# Patient Record
Sex: Female | Born: 1955 | Race: White | Hispanic: No | Marital: Married | Smoking: Never smoker
Health system: Southern US, Community
[De-identification: ages and names within clinical notes are randomized; demographics above are authoritative.]

## PROBLEM LIST (undated history)

## (undated) DIAGNOSIS — H269 Unspecified cataract: Secondary | ICD-10-CM

## (undated) DIAGNOSIS — M1990 Unspecified osteoarthritis, unspecified site: Secondary | ICD-10-CM

## (undated) DIAGNOSIS — E559 Vitamin D deficiency, unspecified: Secondary | ICD-10-CM

## (undated) DIAGNOSIS — R0989 Other specified symptoms and signs involving the circulatory and respiratory systems: Secondary | ICD-10-CM

## (undated) DIAGNOSIS — G44229 Chronic tension-type headache, not intractable: Secondary | ICD-10-CM

## (undated) DIAGNOSIS — F419 Anxiety disorder, unspecified: Secondary | ICD-10-CM

## (undated) DIAGNOSIS — K589 Irritable bowel syndrome without diarrhea: Secondary | ICD-10-CM

## (undated) DIAGNOSIS — M810 Age-related osteoporosis without current pathological fracture: Secondary | ICD-10-CM

## (undated) DIAGNOSIS — F32A Depression, unspecified: Secondary | ICD-10-CM

## (undated) DIAGNOSIS — T7840XA Allergy, unspecified, initial encounter: Secondary | ICD-10-CM

## (undated) DIAGNOSIS — F329 Major depressive disorder, single episode, unspecified: Secondary | ICD-10-CM

## (undated) HISTORY — DX: Allergy, unspecified, initial encounter: T78.40XA

## (undated) HISTORY — DX: Age-related osteoporosis without current pathological fracture: M81.0

## (undated) HISTORY — PX: COLON SURGERY: SHX602

## (undated) HISTORY — DX: Irritable bowel syndrome, unspecified: K58.9

## (undated) HISTORY — PX: APPENDECTOMY: SHX54

## (undated) HISTORY — DX: Major depressive disorder, single episode, unspecified: F32.9

## (undated) HISTORY — DX: Vitamin D deficiency, unspecified: E55.9

## (undated) HISTORY — DX: Depression, unspecified: F32.A

## (undated) HISTORY — DX: Other specified symptoms and signs involving the circulatory and respiratory systems: R09.89

## (undated) HISTORY — DX: Unspecified osteoarthritis, unspecified site: M19.90

## (undated) HISTORY — DX: Chronic tension-type headache, not intractable: G44.229

## (undated) HISTORY — PX: BILATERAL TEMPOROMANDIBULAR JOINT ARTHROPLASTY: SUR77

## (undated) HISTORY — DX: Unspecified cataract: H26.9

## (undated) HISTORY — DX: Anxiety disorder, unspecified: F41.9

## (undated) HISTORY — PX: ABDOMINAL HYSTERECTOMY: SHX81

---

## 1979-04-16 HISTORY — PX: TUBAL LIGATION: SHX77

## 1989-04-15 HISTORY — PX: HEMICOLECTOMY: SHX854

## 1997-11-16 ENCOUNTER — Other Ambulatory Visit: Admission: RE | Admit: 1997-11-16 | Discharge: 1997-11-16 | Payer: Self-pay | Admitting: Obstetrics & Gynecology

## 1998-11-06 ENCOUNTER — Other Ambulatory Visit: Admission: RE | Admit: 1998-11-06 | Discharge: 1998-11-06 | Payer: Self-pay | Admitting: Obstetrics & Gynecology

## 1999-01-26 ENCOUNTER — Encounter: Admission: RE | Admit: 1999-01-26 | Discharge: 1999-01-26 | Payer: Self-pay | Admitting: Obstetrics & Gynecology

## 1999-06-08 ENCOUNTER — Ambulatory Visit (HOSPITAL_COMMUNITY): Admission: RE | Admit: 1999-06-08 | Discharge: 1999-06-08 | Payer: Self-pay | Admitting: Internal Medicine

## 1999-06-08 ENCOUNTER — Encounter: Payer: Self-pay | Admitting: Internal Medicine

## 1999-12-27 ENCOUNTER — Other Ambulatory Visit: Admission: RE | Admit: 1999-12-27 | Discharge: 1999-12-27 | Payer: Self-pay | Admitting: Obstetrics & Gynecology

## 2000-01-28 ENCOUNTER — Encounter: Payer: Self-pay | Admitting: Obstetrics & Gynecology

## 2000-01-28 ENCOUNTER — Encounter: Admission: RE | Admit: 2000-01-28 | Discharge: 2000-01-28 | Payer: Self-pay | Admitting: Obstetrics & Gynecology

## 2000-09-23 ENCOUNTER — Encounter: Payer: Self-pay | Admitting: Obstetrics & Gynecology

## 2000-09-23 ENCOUNTER — Ambulatory Visit (HOSPITAL_COMMUNITY): Admission: RE | Admit: 2000-09-23 | Discharge: 2000-09-23 | Payer: Self-pay | Admitting: Obstetrics & Gynecology

## 2001-02-05 ENCOUNTER — Other Ambulatory Visit: Admission: RE | Admit: 2001-02-05 | Discharge: 2001-02-05 | Payer: Self-pay | Admitting: Obstetrics & Gynecology

## 2002-03-09 ENCOUNTER — Other Ambulatory Visit: Admission: RE | Admit: 2002-03-09 | Discharge: 2002-03-09 | Payer: Self-pay | Admitting: Obstetrics & Gynecology

## 2002-07-05 ENCOUNTER — Encounter (INDEPENDENT_AMBULATORY_CARE_PROVIDER_SITE_OTHER): Payer: Self-pay | Admitting: Specialist

## 2002-07-05 ENCOUNTER — Observation Stay (HOSPITAL_COMMUNITY): Admission: RE | Admit: 2002-07-05 | Discharge: 2002-07-06 | Payer: Self-pay | Admitting: Obstetrics & Gynecology

## 2003-03-16 ENCOUNTER — Other Ambulatory Visit: Admission: RE | Admit: 2003-03-16 | Discharge: 2003-03-16 | Payer: Self-pay | Admitting: Obstetrics & Gynecology

## 2004-05-14 ENCOUNTER — Other Ambulatory Visit: Admission: RE | Admit: 2004-05-14 | Discharge: 2004-05-14 | Payer: Self-pay | Admitting: Obstetrics & Gynecology

## 2005-05-22 ENCOUNTER — Other Ambulatory Visit: Admission: RE | Admit: 2005-05-22 | Discharge: 2005-05-22 | Payer: Self-pay | Admitting: Obstetrics & Gynecology

## 2005-08-16 ENCOUNTER — Ambulatory Visit (HOSPITAL_COMMUNITY): Admission: RE | Admit: 2005-08-16 | Discharge: 2005-08-16 | Payer: Self-pay | Admitting: Gastroenterology

## 2007-08-12 ENCOUNTER — Encounter: Admission: RE | Admit: 2007-08-12 | Discharge: 2007-08-12 | Payer: Self-pay | Admitting: Obstetrics & Gynecology

## 2007-09-08 ENCOUNTER — Emergency Department: Payer: Self-pay | Admitting: Emergency Medicine

## 2007-09-08 ENCOUNTER — Ambulatory Visit: Payer: Self-pay | Admitting: Family Medicine

## 2007-09-29 ENCOUNTER — Ambulatory Visit: Payer: Self-pay | Admitting: Internal Medicine

## 2008-05-01 ENCOUNTER — Ambulatory Visit: Payer: Self-pay | Admitting: Internal Medicine

## 2010-05-06 ENCOUNTER — Encounter: Payer: Self-pay | Admitting: Obstetrics & Gynecology

## 2010-08-31 NOTE — H&P (Signed)
Briana Charles, Charles                         ACCOUNT NO.:  [id number]   MEDICAL RECORD NO.:  [id number]                   PATIENT TYPE:  OBV   LOCATION:  9111                                 FACILITY:  [facility]   PHYSICIAN:  Freddy Finner, M.D.                DATE OF BIRTH:  [birth year]   DATE OF ADMISSION:  07/05/2002  DATE OF DISCHARGE:                                HISTORY & PHYSICAL   PREOPERATIVE DIAGNOSES:  1. Chronic pelvic pain.  2. Known history of pelvic adhesions and endometriosis.  3. Suspected adenomyosis.   POSTOPERATIVE DIAGNOSES:  1. Chronic pelvic pain.  2. Known history of pelvic adhesions and endometriosis.  3. Suspected adenomyosis.   PROCEDURE:  Laparoscopically-assisted vaginal hysterectomy, bilateral  salpingo-oophorectomy.   SURGEON:  Freddy Finner, M.D.   ASSISTANT:   ANESTHESIA:  Endotracheal.   ESTIMATED INTRAOPERATIVE BLOOD LOSS:  100 mL.   INTRAOPERATIVE COMPLICATIONS:  None.   INDICATIONS:  Details of the present admission are recorded in the admission  note.  The patient was admitted on the morning of surgery.  She received 1 g  of Cefotan IV.  She was placed on PAS hose.   DESCRIPTION OF PROCEDURE:  She was brought to the operating room and placed  under adequate general endotracheal anesthesia, placed in the dorsal  lithotomy position using the [facility] stirrup system.  A Betadine prep of the  abdomen, perineum and vagina was carried out with scrub followed by  solution.  Bladder was evacuated with a Robinson catheter.  Hulka tenaculum  was attached to the cervix under direct visualization.  Sterile drapes were  applied.  Two small incisions were made in the abdomen, one in the umbilicus  and one just above the symphysis.  Through the umbilical incision, an 11 mm  disposable nonbladed trocar was introduced while elevating the anterior  abdominal wall manually.  Direct inspection revealed adequate placement with  no evidence of injury on  entry.  As per her previous laparoscopy in 2000,  there were extensive omental adhesions through the anterior abdominal wall  consistent with previous colonic surgery.  There was no injury to vessels on  entry, and a window was created in the omentum to allow passage of the  laparoscope deeper into the abdomen to visualize the pelvis.  There were no  structures adherent to the pelvis at this point in time.  No obvious  peritoneal disease.  The uterus itself was somewhat boggy and slight  enlarged.  The tubes and ovaries were normal.  A 5 mm trocar was placed in  the lower incision and through it grasping forceps placed to elevate the  adnexae.  Using the Gyrus tripolar dissecting and cauterizing device the  infundibulopelvic ligaments and upper broad ligaments were progressively  developed, sealed and divided.  This released both tubes and ovaries to a  level just above the uterine arteries.  Attention was then turned vaginally.  A posterior weighted retractor was placed.  The cervix was grasped with a  single-tooth tenaculum and the Hulka tenaculum removed.  Colpotomy incision  was made while tenting the mucosa posterior to the cervix.  The cervix was  released from the vaginal mucosa with the scalpel and a circumferential  incision.  Using the LigaSure system, the uterosacral pedicles were sealed  and divided.  The bladder was advanced.  Lateral Kochers were taken with the  LigaSure sealed and divided.  The bladder was further advanced off the  cervix and lower segment.  Cardinal ligament pedicles were taken.  LigaSure  sealed and divided.  Anterior peritoneum was entered.  The vessel pedicles  were then taken with the LigaSure system, sealed and divided.  The uterus  was delivered through the vaginal introitus.  The remaining small pedicles  on each side were sealed with LigaSure and divided.  Angles of the vagina  were anchored to the uterosacrals with mattress sutures of 0 Monocryl.   The  uterosacrals were plicated and posterior peritoneum closed with 0 Monocryl.  The cuff was closed vertically with figure-of-eight sutures of 0 Monocryl.  The Foley catheter was placed.  The inspection laparoscopically was then  carried out.  One small area near the left uterosacral was coagulated with  the Gyrus forceps.  This produced complete hemostasis.  The gas was allowed  to escape from the abdomen.  All instruments were removed.  The abdominal  incisions were closed with interrupted subcuticular sutures of 3-0 Dexon and  0.25 inch Steri-Strips to the lower incision.  Marcaine 0.25% plain was  injected into the incision sites for postoperative analgesia.  The patient  was awakened and taken to recovery in good condition.                                               Freddy Finner, M.D.    WRN/MEDQ  D:  07/05/2002  T:  07/05/2002  Job:  [id number]

## 2010-08-31 NOTE — H&P (Signed)
Briana Charles, Briana Charles                         ACCOUNT NO.:  [id number]   MEDICAL RECORD NO.:  [id number]                   PATIENT TYPE:  AMB   LOCATION:  SDC                                  FACILITY:  [facility]   PHYSICIAN:  Freddy Finner, M.D.                DATE OF BIRTH:  Feb 01, 1956   DATE OF ADMISSION:  07/05/2002  DATE OF DISCHARGE:                                HISTORY & PHYSICAL   ADMISSION DIAGNOSES:  1. Chronic pelvic pain, suspected adenomyosis.  2. Previous diagnosis of pelvic endometriosis, pelvic adhesions, previous     surgical treatment by laparoscopy on two different occasions with     uterosacral nerve ablation, lysis of adhesions, and vaporization of     endometriosis.   HISTORY OF PRESENT ILLNESS:  The patient is a 55 year old white married  female, gravida 2, para 2, who had tubal sterilization in 1980.  She was  first seen at my practice in 1996.  At that time she was having some  dysmenorrhea and subsequent underwent laparoscopy in 1990 with treatment as  described above.  Over the course of the years, the patient has had chronic  complaints, particularly of right-sided lower abdominal and pelvic pain,  some pain into her gluteal area.  She has had extensive evaluations  including neurologic, gastroenterology, and even had partial colectomy for  abnormal cecum with removal of the appendix at that time.  Her most recent  laparoscopy was in 2002, at which time she also had lysis of adhesions due  to cycled nerve ablation and progression of pelvic endometriosis.  Omental  adhesions from her previous colectomy were not resected at that time.  Recent CTs of the abdomen and pelvis had been normal.  The patient continues  to complain of pain and dysmenorrhea.  She has requested definitive pelvic  surgery and is now admitted for laparoscopically assisted vaginal  hysterectomy and bilateral salpingo-oophorectomy.   PAST SURGICAL HISTORY:  __________ .   REVIEW OF  SYSTEMS:  No cardiopulmonary, GI, or other symptoms.   PAST MEDICAL HISTORY:  Basically as outlined above.  The patient has given  birth to two children vaginally.  She has had orthopedic evaluation with no  definitive diagnosis as well as neurological with no definitive diagnosis.   ALLERGIES:  Her only known drug allergy was to SULFA.   SOCIAL HISTORY:  She is not a cigarette smoker.  She does not use alcohol.  She has never had a blood transfusion.   FAMILY HISTORY:  Noncontributory.   PHYSICAL EXAMINATION:  HEENT:  Grossly within normal limits.  NECK:  Thyroid gland is not palpably enlarged.  VITAL SIGNS:  Blood pressure in the office was 116/74.  Weight 140.5.  CHEST:  Clear to auscultation.  HEART:  Normal sinus rhythm, significant for systolic click (please note  that she has a previous diagnosis made by 2-D echo of mitral valve  prolapse).  BREASTS:  Exam is considered to be normal.  No masses, no palpable nodules.  Skin is normal.  No breast discharge.  Most recent mammogram was in October  of 2003 and was normal.  ABDOMEN:  Soft and nontender without appreciable organomegaly.  PELVIC:  External genitalia, vagina, and cervix were normal.  On bimanual  exam the uterus is anterior in position, slightly enlarged and boggy, and  mild to moderately tender.  EXTREMITIES:  Without cyanosis, clubbing, or edema.   ASSESSMENT:  Chronic pelvic pain.  Extensive previous workup noted above.  Known previous pelvic endometriosis and pelvic adhesions.  Suspected  adenomyosis.   PLAN:  Laparoscopically assisted vaginal hysterectomy, bilateral salpingo-  oophorectomy.  The patient has reviewed a video in the office describing the  operative procedure including potential complications.  She is prepared to  proceed at this time with surgery.                                                  Freddy Finner, M.D.    WRN/MEDQ  D:  07/04/2002  T:  07/04/2002  Job:  [id number]

## 2010-08-31 NOTE — Op Note (Signed)
Briana Charles, BOESEN               ACCOUNT NO.:  [id number]   MEDICAL RECORD NO.:  [id number]          PATIENT TYPE:  AMB   LOCATION:  ENDO                         FACILITY:  [facility]   PHYSICIAN:  Bernette Redbird, M.D.   DATE OF BIRTH:  January 01, 1956   DATE OF PROCEDURE:  08/16/2005  DATE OF DISCHARGE:                                 OPERATIVE REPORT   PROCEDURE:  Colonoscopy.   ENDOSCOPIST:  Bernette Redbird, M.D.   INDICATION:  Family history of colon cancer in a 55 year old female.  Her  father recently passed away from colon cancer, having been diagnosed in his  late 65s.  The patient herself is remotely status post a partial right  hemicolectomy because of a floppy cecum with questionable cecal volvulus  accounting for abdominal pain in the past.   FINDINGS:  Normal exam to the ileocolonic anastomosis.   PROCEDURE:  The purpose and the risks of the procedure had been discussed  with the patient, who provided written consent.  Sedation was fentanyl 75  mcg, Versed 6 mg and Phenergan 12.5 mg IV, without arrhythmias or  desaturation.  The Olympus adjustable-tension pediatric video colonoscope  was advanced to the ileocolonic anastomosis, as evidenced by the absence of  further lumen, transillumination of the right mid-abdominal region, and what  I believe was small bowel mucosa, although the small bowel was not freely  intubated.  Pullback was then performed.  The quality of prep was good,  although a certain amount of suctioning and rinsing were needed to get up  some puddles of liquid stool.  It is felt that essentially all areas were  adequately seen.   This was a normal examination.  No polyps, cancer, colitis, vascular  malformations or diverticulosis were noted and retroflexion in the rectum  and reinspection in the rectum were unremarkable.  No biopsies were  obtained.  The patient tolerated the procedure well and there were no  apparent complications.   IMPRESSION:  Normal  screening colonoscopy in a patient with a family history  of colon cancer in a first-degree relative at advanced age (V16.0).   PLAN:  Repeat colonoscopy in 5 years.           ______________________________  Bernette Redbird, M.D.     RB/MEDQ  D:  08/16/2005  T:  08/17/2005  Job:  [id number]   cc:   Sheppard Plumber. Earlene Plater, M.D.  [address]  [place]  [place] [address]   W. Varney Baas, M.D.  Fax: [phone]   Lucky Cowboy, M.D.  Fax: [phone]

## 2012-09-30 ENCOUNTER — Other Ambulatory Visit: Payer: Self-pay | Admitting: Internal Medicine

## 2012-09-30 DIAGNOSIS — R1011 Right upper quadrant pain: Secondary | ICD-10-CM

## 2012-10-02 ENCOUNTER — Ambulatory Visit
Admission: RE | Admit: 2012-10-02 | Discharge: 2012-10-02 | Disposition: A | Discharge status: Home / Self Care | Payer: Managed Care, Other (non HMO) | Source: Ambulatory Visit | Attending: Internal Medicine | Admitting: Internal Medicine

## 2012-10-02 DIAGNOSIS — R1011 Right upper quadrant pain: Secondary | ICD-10-CM

## 2013-04-29 ENCOUNTER — Encounter: Payer: Self-pay | Admitting: *Deleted

## 2013-04-29 NOTE — Progress Notes (Signed)
This encounter was created in error - please disregard.

## 2013-09-22 ENCOUNTER — Other Ambulatory Visit: Payer: Self-pay | Admitting: Internal Medicine

## 2014-01-30 DIAGNOSIS — F32A Depression, unspecified: Secondary | ICD-10-CM | POA: Insufficient documentation

## 2014-01-30 DIAGNOSIS — K589 Irritable bowel syndrome without diarrhea: Secondary | ICD-10-CM | POA: Insufficient documentation

## 2014-01-30 DIAGNOSIS — R0989 Other specified symptoms and signs involving the circulatory and respiratory systems: Secondary | ICD-10-CM | POA: Insufficient documentation

## 2014-01-30 DIAGNOSIS — E559 Vitamin D deficiency, unspecified: Secondary | ICD-10-CM | POA: Insufficient documentation

## 2014-01-30 DIAGNOSIS — G44229 Chronic tension-type headache, not intractable: Secondary | ICD-10-CM | POA: Insufficient documentation

## 2014-01-30 DIAGNOSIS — F329 Major depressive disorder, single episode, unspecified: Secondary | ICD-10-CM | POA: Insufficient documentation

## 2014-02-02 ENCOUNTER — Ambulatory Visit (INDEPENDENT_AMBULATORY_CARE_PROVIDER_SITE_OTHER): Payer: Managed Care, Other (non HMO) | Admitting: Internal Medicine

## 2014-02-02 ENCOUNTER — Encounter: Payer: Self-pay | Admitting: Internal Medicine

## 2014-02-02 VITALS — BP 110/72 | HR 72 | Temp 97.8°F | Resp 16 | Ht 68.0 in | Wt 139.8 lb

## 2014-02-02 DIAGNOSIS — Z0000 Encounter for general adult medical examination without abnormal findings: Secondary | ICD-10-CM | POA: Insufficient documentation

## 2014-02-02 DIAGNOSIS — Z1212 Encounter for screening for malignant neoplasm of rectum: Secondary | ICD-10-CM

## 2014-02-02 DIAGNOSIS — K589 Irritable bowel syndrome without diarrhea: Secondary | ICD-10-CM

## 2014-02-02 DIAGNOSIS — Z113 Encounter for screening for infections with a predominantly sexual mode of transmission: Secondary | ICD-10-CM

## 2014-02-02 DIAGNOSIS — R6889 Other general symptoms and signs: Secondary | ICD-10-CM

## 2014-02-02 DIAGNOSIS — Z23 Encounter for immunization: Secondary | ICD-10-CM

## 2014-02-02 DIAGNOSIS — R945 Abnormal results of liver function studies: Secondary | ICD-10-CM

## 2014-02-02 DIAGNOSIS — E559 Vitamin D deficiency, unspecified: Secondary | ICD-10-CM

## 2014-02-02 DIAGNOSIS — R0989 Other specified symptoms and signs involving the circulatory and respiratory systems: Secondary | ICD-10-CM

## 2014-02-02 DIAGNOSIS — E782 Mixed hyperlipidemia: Secondary | ICD-10-CM

## 2014-02-02 DIAGNOSIS — G44229 Chronic tension-type headache, not intractable: Secondary | ICD-10-CM

## 2014-02-02 DIAGNOSIS — I10 Essential (primary) hypertension: Secondary | ICD-10-CM

## 2014-02-02 DIAGNOSIS — R7989 Other specified abnormal findings of blood chemistry: Secondary | ICD-10-CM

## 2014-02-02 DIAGNOSIS — Z0001 Encounter for general adult medical examination with abnormal findings: Secondary | ICD-10-CM

## 2014-02-02 DIAGNOSIS — Z79899 Other long term (current) drug therapy: Secondary | ICD-10-CM

## 2014-02-02 DIAGNOSIS — Z111 Encounter for screening for respiratory tuberculosis: Secondary | ICD-10-CM

## 2014-02-02 DIAGNOSIS — R7309 Other abnormal glucose: Secondary | ICD-10-CM

## 2014-02-02 NOTE — Progress Notes (Signed)
Patient ID: Briana EmmerRhonda Charles, female   DOB: [birth year], 58 y.o.   MRN: [id number]  Annual Screening/Preventative Comprehensive Examination  This very nice 58 y.o.MWF presents for complete physical.  Patient has been monitored for HTN,  Prediabetes, Hyperlipidemia, GERD, IBS,  Chronic HA's and Vitamin D Deficiency. Patient does have hx/o depression and apparently is doing well on current therapy.    Patient has hx/o elevated BP in the remote past and has been monitored expectantly.  Patient's BP has been controlled at home and patient denies any cardiac symptoms as chest pain, palpitations, shortness of breath, dizziness or ankle swelling. Today's BP: 110/72 mmHg    Patient's hyperlipidemia is controlled with diet. Last lipid was at goal - TC 177, TG 66, HDL 75 and LDL 89 in Oct 2014. Marland Kitchen. Patient denies myalgias or other medication SE's. Last lipids were    Patient has been screened for prediabetes and patient denies reactive hypoglycemic symptoms, visual blurring, diabetic polys, or paresthesias. Last A1c was 5.5% in Oct 2014.   Finally, patient has history of Vitamin D Deficiency and last Vitamin D was 2194 in Oct  2014.  Medication Sig  . buPROPion (WELLBUTRIN XL) 300 MG 24 hr tablet Take 300 mg by mouth daily.  Marland Kitchen. dicyclomine (BENTYL) 20 MG tablet TAKE 1 TABLET BY MOUTH FOUR TIMES DAILY BEFORE A MEAL AND AT BEDTIME   Allergies  Allergen Reactions  . Luvox [Fluvoxamine] Nausea And Vomiting  . Sulfa Antibiotics Rash   Past Medical History  Diagnosis Date  . IBS (irritable bowel syndrome)   . Labile hypertension   . Vitamin D deficiency   . Depression   . Chronic tension headaches    Health Maintenance  Topic Date Due  . Pap Smear  12/11/1972  . Mammogram  08/11/2009  . Influenza Vaccine  11/13/2013  . Tetanus/tdap  01/11/2020  . Colonoscopy  02/05/2021   Immunization History  Administered Date(s) Administered  . Influenza Split 02/02/2014  . PPD Test 02/02/2014  .  Pneumococcal-Unspecified 01/27/2012  . Tdap 01/10/2010   Past Surgical History  Procedure Laterality Date  . Tubal ligation  1981  . Bilateral temporomandibular joint arthroplasty  1986, 1988  . Hemicolectomy  1991   Family History  Problem Relation Age of Onset  . Cancer Mother     lung  . Cancer Father     colon  . Alzheimer's disease Father    History  Substance Use Topics  . Smoking status: Never Smoker   . Smokeless tobacco: Never Used  . Alcohol Use: Yes     Comment: Social   ROS Constitutional: Denies fever, chills, weight loss/gain, headaches, insomnia, fatigue, night sweats, and change in appetite. Eyes: Denies redness, blurred vision, diplopia, discharge, itchy, watery eyes.  ENT: Denies discharge, congestion, post nasal drip, epistaxis, sore throat, earache, hearing loss, dental pain, Tinnitus, Vertigo, Sinus pain, snoring.  Cardio: Denies chest pain, palpitations, irregular heartbeat, syncope, dyspnea, diaphoresis, orthopnea, PND, claudication, edema Respiratory: denies cough, dyspnea, DOE, pleurisy, hoarseness, laryngitis, wheezing.  Gastrointestinal: Denies dysphagia, heartburn, reflux, water brash, pain, cramps, nausea, vomiting, bloating, diarrhea, constipation, hematemesis, melena, hematochezia, jaundice, hemorrhoids Genitourinary: Denies dysuria, frequency, urgency, nocturia, hesitancy, discharge, hematuria, flank pain Breast: Breast lumps, nipple discharge, bleeding.  Musculoskeletal: Denies arthralgia, myalgia, stiffness, Jt. Swelling, pain, limp, and strain/sprain. Denies falls. Skin: Denies puritis, rash, hives, warts, acne, eczema, changing in skin lesion Neuro: No weakness, tremor, incoordination, spasms, paresthesia, pain Psychiatric: Denies confusion, memory loss, sensory loss. Denies Depression. Endocrine: Denies change  in weight, skin, hair change, nocturia, and paresthesia, diabetic polys, visual blurring, hyper / hypo glycemic episodes.  Heme/Lymph:  No excessive bleeding, bruising, enlarged lymph nodes.  Physical Exam  BP 110/72  Pulse 72  Temp97.8 F  Resp 16  Ht 5\' 8"    Wt 139 lb 12.8 oz   BMI 21.26   General Appearance: Well nourished and in no apparent distress. Eyes: PERRLA, EOMs, conjunctiva no swelling or erythema, normal fundi and vessels. Sinuses: No frontal/maxillary tenderness ENT/Mouth: EACs patent / TMs  nl. Nares clear without erythema, swelling, mucoid exudates. Oral hygiene is good. No erythema, swelling, or exudate. Tongue normal, non-obstructing. Tonsils not swollen or erythematous. Hearing normal.  Neck: Supple, thyroid normal. No bruits, nodes or JVD. Respiratory: Respiratory effort normal.  BS equal and clear bilateral without rales, rhonci, wheezing or stridor. Cardio: Heart sounds are normal with regular rate and rhythm and no murmurs, rubs or gallops. Peripheral pulses are normal and equal bilaterally without edema. No aortic or femoral bruits. Chest: symmetric with normal excursions and percussion. Breasts: Deferred to Dr Jennette KettleNeal Abdomen: Flat, soft, with bowl sounds. Nontender, no guarding, rebound, hernias, masses, or organomegaly.  Lymphatics: Non tender without lymphadenopathy.  Genitourinary: Deferred to Dr Konrad Doloreson Neal Musculoskeletal: Full ROM all peripheral extremities, joint stability, 5/5 strength, and normal gait. Skin: Warm and dry without rashes, lesions, cyanosis, clubbing or  ecchymosis.  Neuro: Cranial nerves intact, reflexes equal bilaterally. Normal muscle tone, no cerebellar symptoms. Sensation intact.  Pysch: Awake and oriented X 3, normal affect, Insight and Judgment appropriate.   Assessment and Plan  1. Annual Screening Examination 2. Labile  Hypertension  3. Hyperlipidemia 4. Pre Diabetes 5. Vitamin D Deficiency   Continue prudent diet as discussed, weight control, BP monitoring, regular exercise, and medications. Discussed med's effects and SE's. Screening labs and tests as  requested with regular follow-up as recommended.

## 2014-02-02 NOTE — Patient Instructions (Signed)
Recommend the book "The END of DIETING" by Dr Baker Janus   and the book "The END of DIABETES " by Dr Excell Seltzer  At [email or web address] - get book & Audio CD's      Being diabetic has a  300% increased risk for heart attack, stroke, cancer, and alzheimer- type vascular dementia. It is very important that you work harder with diet by avoiding all foods that are white except chicken & fish. Avoid white rice (brown & wild rice is OK), white potatoes (sweetpotatoes in moderation is OK), White bread or wheat bread or anything made out of white flour like bagels, donuts, rolls, buns, biscuits, cakes, pastries, cookies, pizza crust, and pasta (made from white flour & egg whites) - vegetarian pasta or spinach or wheat pasta is OK. Multigrain breads like Arnold's or [employer], or multigrain sandwich thins or flatbreads.  Diet, exercise and weight loss can reverse and cure diabetes in the early stages.  Diet, exercise and weight loss is very important in the control and prevention of complications of diabetes which affects every system in your body, ie. Brain - dementia/stroke, eyes - glaucoma/blindness, heart - heart attack/heart failure, kidneys - dialysis, stomach - gastric paralysis, intestines - malabsorption, nerves - severe painful neuritis, circulation - gangrene & loss of a leg(s), and finally cancer and Alzheimers.    I recommend avoid fried & greasy foods,  sweets/candy, white rice (brown or wild rice or Quinoa is OK), white potatoes (sweet potatoes are OK) - anything made from white flour - bagels, doughnuts, rolls, buns, biscuits,white and wheat breads, pizza crust and traditional pasta made of white flour & egg white(vegetarian pasta or spinach or wheat pasta is OK).  Multi-grain bread is OK - like multi-grain flat bread or sandwich thins. Avoid alcohol in excess. Exercise is also important.    Eat all the vegetables you want - avoid meat, especially red meat and dairy - especially cheese.  Cheese  is the most concentrated form of trans-fats which is the worst thing to clog up our arteries. Veggie cheese is OK which can be found in the fresh produce section at [employer] or [employer] or [employer]  Preventive Care for Adults A healthy lifestyle and preventive care can promote health and wellness. Preventive health guidelines for women include the following key practices.  A routine yearly physical is a good way to check with your health care provider about your health and preventive screening. It is a chance to share any concerns and updates on your health and to receive a thorough exam.  Visit your dentist for a routine exam and preventive care every 6 months. Brush your teeth twice a day and floss once a day. Good oral hygiene prevents tooth decay and gum disease.  The frequency of eye exams is based on your age, health, family medical history, use of contact lenses, and other factors. Follow your health care provider's recommendations for frequency of eye exams.  Eat a healthy diet. Foods like vegetables, fruits, whole grains, low-fat dairy products, and lean protein foods contain the nutrients you need without too many calories. Decrease your intake of foods high in solid fats, added sugars, and salt. Eat the right amount of calories for you.Get information about a proper diet from your health care provider, if necessary.  Regular physical exercise is one of the most important things you can do for your health. Most adults should get at least 150 minutes of moderate-intensity exercise (any activity that increases  your heart rate and causes you to sweat) each week. In addition, most adults need muscle-strengthening exercises on 2 or more days a week.  Maintain a healthy weight. The body mass index (BMI) is a screening tool to identify possible weight problems. It provides an estimate of body fat based on height and weight. Your health care provider can find your BMI and can help you  achieve or maintain a healthy weight.For adults 20 years and older:  A BMI below 18.5 is considered underweight.  A BMI of 18.5 to 24.9 is normal.  A BMI of 25 to 29.9 is considered overweight.  A BMI of 30 and above is considered obese.  Maintain normal blood lipids and cholesterol levels by exercising and minimizing your intake of saturated fat. Eat a balanced diet with plenty of fruit and vegetables. Blood tests for lipids and cholesterol should begin at age 38 and be repeated every 5 years. If your lipid or cholesterol levels are high, you are over 50, or you are at high risk for heart disease, you may need your cholesterol levels checked more frequently.Ongoing high lipid and cholesterol levels should be treated with medicines if diet and exercise are not working.  If you smoke, find out from your health care provider how to quit. If you do not use tobacco, do not start.  Lung cancer screening is recommended for adults aged 64-80 years who are at high risk for developing lung cancer because of a history of smoking. A yearly low-dose CT scan of the lungs is recommended for people who have at least a 30-pack-year history of smoking and are a current smoker or have quit within the past 15 years. A pack year of smoking is smoking an average of 1 pack of cigarettes a day for 1 year (for example: 1 pack a day for 30 years or 2 packs a day for 15 years). Yearly screening should continue until the smoker has stopped smoking for at least 15 years. Yearly screening should be stopped for people who develop a health problem that would prevent them from having lung cancer treatment.  High blood pressure causes heart disease and increases the risk of stroke. Your blood pressure should be checked at least every 1 to 2 years. Ongoing high blood pressure should be treated with medicines if weight loss and exercise do not work.  If you are 79-57 years old, ask your health care provider if you should take  aspirin to prevent strokes.  Diabetes screening involves taking a blood sample to check your fasting blood sugar level. This should be done once every 3 years, after age 37, if you are within normal weight and without risk factors for diabetes. Testing should be considered at a younger age or be carried out more frequently if you are overweight and have at least 1 risk factor for diabetes.  Breast cancer screening is essential preventive care for women. You should practice "breast self-awareness." This means understanding the normal appearance and feel of your breasts and may include breast self-examination. Any changes detected, no matter how small, should be reported to a health care provider. Women in their 76s and 30s should have a clinical breast exam (CBE) by a health care provider as part of a regular health exam every 1 to 3 years. After age 55, women should have a CBE every year. Starting at age 79, women should consider having a mammogram (breast X-ray test) every year. Women who have a family history of breast  cancer should talk to their health care provider about genetic screening. Women at a high risk of breast cancer should talk to their health care providers about having an MRI and a mammogram every year.  Breast cancer gene (BRCA)-related cancer risk assessment is recommended for women who have family members with BRCA-related cancers. BRCA-related cancers include breast, ovarian, tubal, and peritoneal cancers. Having family members with these cancers may be associated with an increased risk for harmful changes (mutations) in the breast cancer genes BRCA1 and BRCA2. Results of the assessment will determine the need for genetic counseling and BRCA1 and BRCA2 testing.  Routine pelvic exams to screen for cancer are no longer recommended for nonpregnant women who are considered low risk for cancer of the pelvic organs (ovaries, uterus, and vagina) and who do not have symptoms. Ask your health  care provider if a screening pelvic exam is right for you.  If you have had past treatment for cervical cancer or a condition that could lead to cancer, you need Pap tests and screening for cancer for at least 20 years after your treatment. If Pap tests have been discontinued, your risk factors (such as having a new sexual partner) need to be reassessed to determine if screening should be resumed. Some women have medical problems that increase the chance of getting cervical cancer. In these cases, your health care provider may recommend more frequent screening and Pap tests.  Colorectal cancer can be detected and often prevented. Most routine colorectal cancer screening begins at the age of 58 years and continues through age 44 years. However, your health care provider may recommend screening at an earlier age if you have risk factors for colon cancer. On a yearly basis, your health care provider may provide home test kits to check for hidden blood in the stool. Use of a small camera at the end of a tube, to directly examine the colon (sigmoidoscopy or colonoscopy), can detect the earliest forms of colorectal cancer. Talk to your health care provider about this at age 84, when routine screening begins. Direct exam of the colon should be repeated every 5-10 years through age 33 years, unless early forms of pre-cancerous polyps or small growths are found.  Hepatitis C blood testing is recommended for all people born from [birth year] through 1965 and any individual with known risks for hepatitis C.  Pra  Osteoporosis is a disease in which the bones lose minerals and strength with aging. This can result in serious bone fractures or breaks. The risk of osteoporosis can be identified using a bone density scan. Women ages 36 years and over and women at risk for fractures or osteoporosis should discuss screening with their health care providers. Ask your health care provider whether you should take a calcium supplement  or vitamin D to reduce the rate of osteoporosis.  Menopause can be associated with physical symptoms and risks. Hormone replacement therapy is available to decrease symptoms and risks. You should talk to your health care provider about whether hormone replacement therapy is right for you.  Use sunscreen. Apply sunscreen liberally and repeatedly throughout the day. You should seek shade when your shadow is shorter than you. Protect yourself by wearing long sleeves, pants, a wide-brimmed hat, and sunglasses year round, whenever you are outdoors.  Once a month, do a whole body skin exam, using a mirror to look at the skin on your back. Tell your health care provider of new moles, moles that have irregular borders, moles that are  larger than a pencil eraser, or moles that have changed in shape or color.  Stay current with required vaccines (immunizations).  Influenza vaccine. All adults should be immunized every year.  Tetanus, diphtheria, and acellular pertussis (Td, Tdap) vaccine. Pregnant women should receive 1 dose of Tdap vaccine during each pregnancy. The dose should be obtained regardless of the length of time since the last dose. Immunization is preferred during the 27th-36th week of gestation. An adult who has not previously received Tdap or who does not know her vaccine status should receive 1 dose of Tdap. This initial dose should be followed by tetanus and diphtheria toxoids (Td) booster doses every 10 years. Adults with an unknown or incomplete history of completing a 3-dose immunization series with Td-containing vaccines should begin or complete a primary immunization series including a Tdap dose. Adults should receive a Td booster every 10 years.  Varicella vaccine. An adult without evidence of immunity to varicella should receive 2 doses or a second dose if she has previously received 1 dose. Pregnant females who do not have evidence of immunity should receive the first dose after  pregnancy. This first dose should be obtained before leaving the health care facility. The second dose should be obtained 4-8 weeks after the first dose.  Human papillomavirus (HPV) vaccine. Females aged 13-26 years who have not received the vaccine previously should obtain the 3-dose series. The vaccine is not recommended for use in pregnant females. However, pregnancy testing is not needed before receiving a dose. If a female is found to be pregnant after receiving a dose, no treatment is needed. In that case, the remaining doses should be delayed until after the pregnancy. Immunization is recommended for any person with an immunocompromised condition through the age of 26 years if she did not get any or all doses earlier. During the 3-dose series, the second dose should be obtained 4-8 weeks after the first dose. The third dose should be obtained 24 weeks after the first dose and 16 weeks after the second dose.  Zoster vaccine. One dose is recommended for adults aged 60 years or older unless certain conditions are present.  Measles, mumps, and rubella (MMR) vaccine. Adults born before 1957 generally are considered immune to measles and mumps. Adults born in 1957 or later should have 1 or more doses of MMR vaccine unless there is a contraindication to the vaccine or there is laboratory evidence of immunity to each of the three diseases. A routine second dose of MMR vaccine should be obtained at least 28 days after the first dose for students attending postsecondary schools, health care workers, or international travelers. People who received inactivated measles vaccine or an unknown type of measles vaccine during 1963-1967 should receive 2 doses of MMR vaccine. People who received inactivated mumps vaccine or an unknown type of mumps vaccine before 1979 and are at high risk for mumps infection should consider immunization with 2 doses of MMR vaccine. For females of childbearing age, rubella immunity should  be determined. If there is no evidence of immunity, females who are not pregnant should be vaccinated. If there is no evidence of immunity, females who are pregnant should delay immunization until after pregnancy. Unvaccinated health care workers born before 1957 who lack laboratory evidence of measles, mumps, or rubella immunity or laboratory confirmation of disease should consider measles and mumps immunization with 2 doses of MMR vaccine or rubella immunization with 1 dose of MMR vaccine.  Pneumococcal 13-valent conjugate (PCV13) vaccine. When   indicated, a person who is uncertain of her immunization history and has no record of immunization should receive the PCV13 vaccine. An adult aged 34 years or older who has certain medical conditions and has not been previously immunized should receive 1 dose of PCV13 vaccine. This PCV13 should be followed with a dose of pneumococcal polysaccharide (PPSV23) vaccine. The PPSV23 vaccine dose should be obtained at least 8 weeks after the dose of PCV13 vaccine. An adult aged 43 years or older who has certain medical conditions and previously received 1 or more doses of PPSV23 vaccine should receive 1 dose of PCV13. The PCV13 vaccine dose should be obtained 1 or more years after the last PPSV23 vaccine dose.    Pneumococcal polysaccharide (PPSV23) vaccine. When PCV13 is also indicated, PCV13 should be obtained first. All adults aged 57 years and older should be immunized. An adult younger than age 7 years who has certain medical conditions should be immunized. Any person who resides in a nursing home or long-term care facility should be immunized. An adult smoker should be immunized. People with an immunocompromised condition and certain other conditions should receive both PCV13 and PPSV23 vaccines. People with human immunodeficiency virus (HIV) infection should be immunized as soon as possible after diagnosis. Immunization during chemotherapy or radiation therapy should  be avoided. Routine use of PPSV23 vaccine is not recommended for American Indians, [address], or people younger than 65 years unless there are medical conditions that require PPSV23 vaccine. When indicated, people who have unknown immunization and have no record of immunization should receive PPSV23 vaccine. One-time revaccination 5 years after the first dose of PPSV23 is recommended for people aged 19-64 years who have chronic kidney failure, nephrotic syndrome, asplenia, or immunocompromised conditions. People who received 1-2 doses of PPSV23 before age 56 years should receive another dose of PPSV23 vaccine at age 42 years or later if at least 5 years have passed since the previous dose. Doses of PPSV23 are not needed for people immunized with PPSV23 at or after age 73 years.  Preventive Services / Frequency   Ages 46 to 64 years  Blood pressure check..  Lipid and cholesterol check.  Lung cancer screening. / Every year if you are aged 55-80 years and have a 30-pack-year history of smoking and currently smoke or have quit within the past 15 years. Yearly screening is stopped once you have quit smoking for at least 15 years or develop a health problem that would prevent you from having lung cancer treatment.  Clinical breast exam.** / Every year after age 35 years.  BRCA-related cancer risk assessment.** / For women who have family members with a BRCA-related cancer (breast, ovarian, tubal, or peritoneal cancers).  Mammogram.** / Every year beginning at age 50 years and continuing for as long as you are in good health. Consult with your health care provider.  Pap test.** / Every 3 years starting at age 47 years through age 22 or 95 years with a history of 3 consecutive normal Pap tests.  HPV screening.** / Every 3 years from ages 57 years through ages 79 to 60 years with a history of 3 consecutive normal Pap tests.  Fecal occult blood test (FOBT) of stool. / Every year beginning at age  92 years and continuing until age 16 years. You may not need to do this test if you get a colonoscopy every 10 years.  Flexible sigmoidoscopy or colonoscopy.** / Every 5 years for a flexible sigmoidoscopy or every 10 years  for a colonoscopy beginning at age 67 years and continuing until age 94 years.  Hepatitis C blood test.** / For all people born from [birth year] through 1966 and any individual with known risks for hepatitis C.  Skin self-exam. / Monthly.  Influenza vaccine. / Every year.  Tetanus, diphtheria, and acellular pertussis (Tdap/Td) vaccine.** / Consult your health care provider. Pregnant women should receive 1 dose of Tdap vaccine during each pregnancy. 1 dose of Td every 10 years.  Varicella vaccine.** / Consult your health care provider. Pregnant females who do not have evidence of immunity should receive the first dose after pregnancy.  Zoster vaccine.** / 1 dose for adults aged 77 years or older.                                                                                    Pneumococcal 13-valent conjugate (PCV13) vaccine. (PREVNAR)  Pneumococcal polysaccharide (PPSV23) vaccine. (PNEUMOVAX)    Hepatitis A vaccine.** / Consult your health care provider.  Hepatitis B vaccine.** / Consult your health care provider.

## 2014-02-03 LAB — MICROALBUMIN / CREATININE URINE RATIO
CREATININE, URINE: 12.9 mg/dL
Microalb, Ur: <0.2 mg/dL (ref <2.0)

## 2014-02-03 LAB — HEPATIC FUNCTION PANEL
ALK PHOS: 42 U/L (ref 39–117)
ALT: 24 U/L (ref 0–35)
AST: 28 U/L (ref 0–37)
Albumin: 4.2 g/dL (ref 3.5–5.2)
Bilirubin, Direct: 0.1 mg/dL (ref 0.0–0.3)
Indirect Bilirubin: 0.2 mg/dL (ref 0.2–1.2)
TOTAL PROTEIN: 6.8 g/dL (ref 6.0–8.3)
Total Bilirubin: 0.3 mg/dL (ref 0.2–1.2)

## 2014-02-03 LAB — CBC WITH DIFFERENTIAL/PLATELET
BASOS ABS: 0.1 10*3/uL (ref 0.0–0.1)
BASOS PCT: 1 % (ref 0–1)
EOS ABS: 0.2 10*3/uL (ref 0.0–0.7)
EOS PCT: 3 % (ref 0–5)
HEMATOCRIT: 37.9 % (ref 36.0–46.0)
Hemoglobin: 12.7 g/dL (ref 12.0–15.0)
Lymphocytes Relative: 31 % (ref 12–46)
Lymphs Abs: 1.8 10*3/uL (ref 0.7–4.0)
MCH: 29.3 pg (ref 26.0–34.0)
MCHC: 33.5 g/dL (ref 30.0–36.0)
MCV: 87.3 fL (ref 78.0–100.0)
MONO ABS: 0.4 10*3/uL (ref 0.1–1.0)
Monocytes Relative: 7 % (ref 3–12)
Neutro Abs: 3.4 10*3/uL (ref 1.7–7.7)
Neutrophils Relative %: 58 % (ref 43–77)
PLATELETS: 261 10*3/uL (ref 150–400)
RBC: 4.34 MIL/uL (ref 3.87–5.11)
RDW: 13.6 % (ref 11.5–15.5)
WBC: 5.8 10*3/uL (ref 4.0–10.5)

## 2014-02-03 LAB — IRON AND TIBC
%SAT: 25 % (ref 20–55)
IRON: 75 ug/dL (ref 42–145)
TIBC: 304 ug/dL (ref 250–470)
UIBC: 229 ug/dL (ref 125–400)

## 2014-02-03 LAB — BASIC METABOLIC PANEL WITH GFR
BUN: 10 mg/dL (ref 6–23)
CALCIUM: 9.5 mg/dL (ref 8.4–10.5)
CO2: 28 mEq/L (ref 19–32)
Chloride: 103 mEq/L (ref 96–112)
Creat: 0.8 mg/dL (ref 0.50–1.10)
GFR, EST NON AFRICAN AMERICAN: 81 mL/min
Glucose, Bld: 86 mg/dL (ref 70–99)
Potassium: 5 mEq/L (ref 3.5–5.3)
Sodium: 138 mEq/L (ref 135–145)

## 2014-02-03 LAB — HEPATITIS C ANTIBODY: HCV Ab: NEGATIVE

## 2014-02-03 LAB — LIPID PANEL
CHOLESTEROL: 167 mg/dL (ref 0–200)
HDL: 73 mg/dL (ref >39)
LDL CALC: 83 mg/dL (ref 0–99)
Total CHOL/HDL Ratio: 2.3 Ratio
Triglycerides: 55 mg/dL (ref <150)
VLDL: 11 mg/dL (ref 0–40)

## 2014-02-03 LAB — URINALYSIS, MICROSCOPIC ONLY
BACTERIA UA: NONE SEEN
Casts: NONE SEEN
Crystals: NONE SEEN

## 2014-02-03 LAB — MAGNESIUM: Magnesium: 2.1 mg/dL (ref 1.5–2.5)

## 2014-02-03 LAB — INSULIN, FASTING: INSULIN FASTING, SERUM: 2.3 u[IU]/mL (ref 2.0–19.6)

## 2014-02-03 LAB — VITAMIN D 25 HYDROXY (VIT D DEFICIENCY, FRACTURES): Vit D, 25-Hydroxy: 86 ng/mL (ref 30–89)

## 2014-02-03 LAB — VITAMIN B12: Vitamin B-12: 882 pg/mL (ref 211–911)

## 2014-02-03 LAB — HEPATITIS A ANTIBODY, TOTAL: Hep A Total Ab: NONREACTIVE

## 2014-02-03 LAB — HEPATITIS B SURFACE ANTIBODY,QUALITATIVE: HEP B S AB: NEGATIVE

## 2014-02-03 LAB — HIV ANTIBODY (ROUTINE TESTING W REFLEX): HIV 1&2 Ab, 4th Generation: NONREACTIVE

## 2014-02-03 LAB — HEMOGLOBIN A1C
Hgb A1c MFr Bld: 5.6 % (ref <5.7)
MEAN PLASMA GLUCOSE: 114 mg/dL (ref <117)

## 2014-02-03 LAB — RPR

## 2014-02-03 LAB — HEPATITIS B CORE ANTIBODY, TOTAL: Hep B Core Total Ab: NONREACTIVE

## 2014-02-03 LAB — TSH: TSH: 1.277 u[IU]/mL (ref 0.350–4.500)

## 2014-02-04 LAB — HEPATITIS B E ANTIBODY: Hepatitis Be Antibody: NONREACTIVE

## 2014-02-04 LAB — TB SKIN TEST
Induration: 0 mm
TB Skin Test: NEGATIVE

## 2014-03-21 ENCOUNTER — Encounter: Payer: Self-pay | Admitting: Physician Assistant

## 2014-03-21 ENCOUNTER — Ambulatory Visit (INDEPENDENT_AMBULATORY_CARE_PROVIDER_SITE_OTHER): Payer: Managed Care, Other (non HMO) | Admitting: Physician Assistant

## 2014-03-21 VITALS — BP 138/82 | HR 68 | Temp 98.2°F | Resp 16 | Ht 67.5 in | Wt 140.0 lb

## 2014-03-21 DIAGNOSIS — G44229 Chronic tension-type headache, not intractable: Secondary | ICD-10-CM

## 2014-03-21 DIAGNOSIS — R1011 Right upper quadrant pain: Secondary | ICD-10-CM

## 2014-03-21 DIAGNOSIS — R1013 Epigastric pain: Secondary | ICD-10-CM

## 2014-03-21 DIAGNOSIS — J0100 Acute maxillary sinusitis, unspecified: Secondary | ICD-10-CM

## 2014-03-21 LAB — CBC WITH DIFFERENTIAL/PLATELET
BASOS ABS: 0.1 10*3/uL (ref 0.0–0.1)
BASOS PCT: 1 % (ref 0–1)
Eosinophils Absolute: 0.2 10*3/uL (ref 0.0–0.7)
Eosinophils Relative: 4 % (ref 0–5)
HCT: 39.1 % (ref 36.0–46.0)
Hemoglobin: 13.3 g/dL (ref 12.0–15.0)
Lymphocytes Relative: 28 % (ref 12–46)
Lymphs Abs: 1.5 10*3/uL (ref 0.7–4.0)
MCH: 29.2 pg (ref 26.0–34.0)
MCHC: 34 g/dL (ref 30.0–36.0)
MCV: 85.7 fL (ref 78.0–100.0)
MPV: 10 fL (ref 9.4–12.4)
Monocytes Absolute: 0.5 10*3/uL (ref 0.1–1.0)
Monocytes Relative: 9 % (ref 3–12)
NEUTROS PCT: 58 % (ref 43–77)
Neutro Abs: 3 10*3/uL (ref 1.7–7.7)
PLATELETS: 275 10*3/uL (ref 150–400)
RBC: 4.56 MIL/uL (ref 3.87–5.11)
RDW: 13.9 % (ref 11.5–15.5)
WBC: 5.2 10*3/uL (ref 4.0–10.5)

## 2014-03-21 LAB — AMYLASE: AMYLASE: 40 U/L (ref 0–105)

## 2014-03-21 LAB — LIPASE: LIPASE: 19 U/L (ref 0–75)

## 2014-03-21 MED ORDER — MOMETASONE FUROATE 50 MCG/ACT NA SUSP: 2.0000 | Freq: Every day | NASAL | Status: DC

## 2014-03-21 MED ORDER — MELOXICAM 7.5 MG PO TABS: 7.5000 mg | ORAL_TABLET | Freq: Every day | ORAL | Status: DC

## 2014-03-21 MED ORDER — PREDNISONE 5 MG PO TABS: ORAL_TABLET | ORAL | Status: AC

## 2014-03-21 NOTE — Patient Instructions (Addendum)
- Take Allegra OTC (lowest strength possible) for allergies and to help with fluid behind ear drums.   -While drinking fluids, pinch and hold nose close and swallow.  This will help open up your eustachian tubes to drain the fluid behind your ear drums. -Try steam showers to open your nasal passages.  Drink lots of water to stay hydrated and to thin mucous. -Nasonex-  Take 2 sprays in each nostril at bedtime.  Make sure you spray towards the outside of each nostril towards the outer corner of your eye, hold nose close and tilt head back.  This will help the medication get into your sinuses.   -Take Mobic with food.  Do NOT take Ibuprofen, Advil or Aleve or any other NSAID while on this medication.  -Briana Charles will call you for appt date and time for ultrasound.    Tension Headache A tension headache is a feeling of pain, pressure, or aching often felt over the front and sides of the head. The pain can be dull or can feel tight (constricting). It is the most common type of headache. Tension headaches are not normally associated with nausea or vomiting and do not get worse with physical activity. Tension headaches can last 30 minutes to several days.  CAUSES  The exact cause is not known, but it may be caused by chemicals and hormones in the brain that lead to pain. Tension headaches often begin after stress, anxiety, or depression. Other triggers may include:  Alcohol.  Caffeine (too much or withdrawal).  Respiratory infections (colds, flu, sinus infections).  Dental problems or teeth clenching.  Fatigue.  Holding your head and neck in one position too long while using a computer. SYMPTOMS   Pressure around the head.   Dull, aching head pain.   Pain felt over the front and sides of the head.   Tenderness in the muscles of the head, neck, and shoulders. DIAGNOSIS  A tension headache is often diagnosed based on:   Symptoms.   Physical examination.   A CT scan or MRI of your  head. These tests may be ordered if symptoms are severe or unusual. TREATMENT  Medicines may be given to help relieve symptoms.  HOME CARE INSTRUCTIONS   Only take over-the-counter or prescription medicines for pain or discomfort as directed by your caregiver.   Lie down in a dark, quiet room when you have a headache.   Keep a journal to find out what may be triggering your headaches. For example, write down:  What you eat and drink.  How much sleep you get.  Any change to your diet or medicines.  Try massage or other relaxation techniques.   Ice packs or heat applied to the head and neck can be used. Use these 3 to 4 times per day for 15 to 20 minutes each time, or as needed.   Limit stress.   Sit up straight, and do not tense your muscles.   Quit smoking if you smoke.  Limit alcohol use.  Decrease the amount of caffeine you drink, or stop drinking caffeine.  Eat and exercise regularly.  Get 7 to 9 hours of sleep, or as recommended by your caregiver.  Avoid excessive use of pain medicine as recurrent headaches can occur.  SEEK MEDICAL CARE IF:   You have problems with the medicines you were prescribed.  Your medicines do not work.  You have a change from the usual headache.  You have nausea or vomiting. SEEK IMMEDIATE MEDICAL  CARE IF:   Your headache becomes severe.  You have a fever.  You have a stiff neck.  You have loss of vision.  You have muscular weakness or loss of muscle control.  You lose your balance or have trouble walking.  You feel faint or pass out.  You have severe symptoms that are different from your first symptoms. MAKE SURE YOU:   Understand these instructions.  Will watch your condition.  Will get help right away if you are not doing well or get worse. Document Released: 04/01/2005 Document Revised: 06/24/2011 Document Reviewed: 03/22/2011 [facility] Patient Information 2015 [place], [place]. This information is not  intended to replace advice given to you by your health care provider. Make sure you discuss any questions you have with your health care provider.  Sinusitis Sinusitis is redness, soreness, and inflammation of the paranasal sinuses. Paranasal sinuses are air pockets within the bones of your face (beneath the eyes, the middle of the forehead, or above the eyes). In healthy paranasal sinuses, mucus is able to drain out, and air is able to circulate through them by way of your nose. However, when your paranasal sinuses are inflamed, mucus and air can become trapped. This can allow bacteria and other germs to grow and cause infection. Sinusitis can develop quickly and last only a short time (acute) or continue over a long period (chronic). Sinusitis that lasts for more than 12 weeks is considered chronic.  CAUSES  Causes of sinusitis include:  Allergies.  Structural abnormalities, such as displacement of the cartilage that separates your nostrils (deviated septum), which can decrease the air flow through your nose and sinuses and affect sinus drainage.  Functional abnormalities, such as when the small hairs (cilia) that line your sinuses and help remove mucus do not work properly or are not present. SIGNS AND SYMPTOMS  Symptoms of acute and chronic sinusitis are the same. The primary symptoms are pain and pressure around the affected sinuses. Other symptoms include:  Upper toothache.  Earache.  Headache.  Bad breath.  Decreased sense of smell and taste.  A cough, which worsens when you are lying flat.  Fatigue.  Fever.  Thick drainage from your nose, which often is green and may contain pus (purulent).  Swelling and warmth over the affected sinuses. DIAGNOSIS  Your health care provider will perform a physical exam. During the exam, your health care provider may:  Look in your nose for signs of abnormal growths in your nostrils (nasal polyps).  Tap over the affected sinus to check  for signs of infection.  View the inside of your sinuses (endoscopy) using an imaging device that has a light attached (endoscope). If your health care provider suspects that you have chronic sinusitis, one or more of the following tests may be recommended:  Allergy tests.  Nasal culture. A sample of mucus is taken from your nose, sent to a lab, and screened for bacteria.  Nasal cytology. A sample of mucus is taken from your nose and examined by your health care provider to determine if your sinusitis is related to an allergy. TREATMENT  Most cases of acute sinusitis are related to a viral infection and will resolve on their own within 10 days. Sometimes medicines are prescribed to help relieve symptoms (pain medicine, decongestants, nasal steroid sprays, or saline sprays).  However, for sinusitis related to a bacterial infection, your health care provider will prescribe antibiotic medicines. These are medicines that will help kill the bacteria causing the infection.  Rarely, sinusitis is caused by a fungal infection. In theses cases, your health care provider will prescribe antifungal medicine. For some cases of chronic sinusitis, surgery is needed. Generally, these are cases in which sinusitis recurs more than 3 times per year, despite other treatments. HOME CARE INSTRUCTIONS   Drink plenty of water. Water helps thin the mucus so your sinuses can drain more easily.  Use a humidifier.  Inhale steam 3 to 4 times a day (for example, sit in the bathroom with the shower running).  Apply a warm, moist washcloth to your face 3 to 4 times a day, or as directed by your health care provider.  Use saline nasal sprays to help moisten and clean your sinuses.  Take medicines only as directed by your health care provider.  If you were prescribed either an antibiotic or antifungal medicine, finish it all even if you start to feel better. SEEK IMMEDIATE MEDICAL CARE IF:  You have increasing pain or  severe headaches.  You have nausea, vomiting, or drowsiness.  You have swelling around your face.  You have vision problems.  You have a stiff neck.  You have difficulty breathing. MAKE SURE YOU:   Understand these instructions.  Will watch your condition.  Will get help right away if you are not doing well or get worse. Document Released: 04/01/2005 Document Revised: 08/16/2013 Document Reviewed: 04/16/2011 [facility] Patient Information 2015 [place], [place]. This information is not intended to replace advice given to you by your health care provider. Make sure you discuss any questions you have with your health care provider.

## 2014-03-21 NOTE — Progress Notes (Signed)
Subjective:    Patient ID: Briana EmmerRhonda Charles, female    DOB: 1955-05-22, 58 y.o.   MRN: [id number]  Headache  This is a new problem. Episode onset: 1 week ago. The problem occurs intermittently. The problem has been waxing and waning. Pain location: Pain from neck up.  Pain is all over. The pain does not radiate. The pain quality is similar to prior headaches. The pain is at a severity of 7/10. Associated symptoms include tinnitus. Pertinent negatives include no dizziness, ear pain, fever, rhinorrhea, sinus pressure or sore throat. Associated symptoms comments: Facial puffiness. Nothing (Wakes up with headaches) aggravates the symptoms. Treatments tried: Advil and Tylenol. The treatment provided no relief.  OTC allergy medication makes her nervous. Patient had total hysterectomy. GFR= 81 on 02/02/14 Review of Systems  Constitutional: Positive for fatigue. Negative for fever, chills and diaphoresis.  HENT: Positive for postnasal drip and tinnitus. Negative for congestion, ear discharge, ear pain, rhinorrhea, sinus pressure, sore throat and trouble swallowing.   Eyes: Negative.   Respiratory: Negative.   Cardiovascular: Negative.   Gastrointestinal: Negative.   Genitourinary: Positive for flank pain. Negative for dysuria, urgency, frequency, vaginal bleeding, vaginal discharge and pelvic pain.       Right flank pain  Musculoskeletal: Negative.        Aches and pains  Skin: Negative.  Negative for rash.  Neurological: Positive for headaches. Negative for dizziness and light-headedness.  Psychiatric/Behavioral: The patient is nervous/anxious.        Anxiety, stress and depression   Past Medical History  Diagnosis Date  . IBS (irritable bowel syndrome)   . Labile hypertension   . Vitamin D deficiency   . Depression   . Chronic tension headaches    Current Outpatient Prescriptions on File Prior to Visit  Medication Sig Dispense Refill  . buPROPion (WELLBUTRIN XL) 300 MG 24 hr tablet Take 300  mg by mouth daily.    . Cholecalciferol (VITAMIN D PO) Take 3,000 Units by mouth daily.    Marland Kitchen. dicyclomine (BENTYL) 20 MG tablet TAKE 1 TABLET BY MOUTH FOUR TIMES DAILY BEFORE A MEAL AND AT BEDTIME 120 tablet 0  . MINIVELLE 0.1 MG/24HR patch      No current facility-administered medications on file prior to visit.   Allergies  Allergen Reactions  . Luvox [Fluvoxamine] Nausea And Vomiting  . Sulfa Antibiotics Rash     BP 138/82 mmHg  Pulse 68  Temp(Src) 98.2 F (36.8 C) (Temporal)  Resp 16  Ht 5' 7.5" (1.715 m)  Wt 140 lb (63.504 kg)  BMI 21.59 kg/m2  SpO2 97% Wt Readings from Last 3 Encounters:  03/21/14 140 lb (63.504 kg)  02/02/14 139 lb 12.8 oz (63.413 kg)   Objective:   Physical Exam  Constitutional: She is oriented to person, place, and time. She appears well-developed and well-nourished. She does not have a sickly appearance. No distress.  HENT:  Head: Normocephalic.  Right Ear: External ear and ear canal normal. Tympanic membrane is bulging. Tympanic membrane is not injected, not scarred, not perforated, not erythematous and not retracted.  Left Ear: External ear and ear canal normal. Tympanic membrane is bulging. Tympanic membrane is not injected, not scarred, not perforated, not erythematous and not retracted.  Nose: No mucosal edema or rhinorrhea. Right sinus exhibits maxillary sinus tenderness. Right sinus exhibits no frontal sinus tenderness. Left sinus exhibits maxillary sinus tenderness. Left sinus exhibits no frontal sinus tenderness.  Mouth/Throat: Uvula is midline, oropharynx is clear and moist and mucous  membranes are normal. Mucous membranes are not pale and not dry. No trismus in the jaw. No uvula swelling. No oropharyngeal exudate, posterior oropharyngeal edema, posterior oropharyngeal erythema or tonsillar abscesses.  TMs bulging bilaterally with clear fluid.  TMs non-erythematous and non-edematous bilaterally. Turbinates mildly erythematous bilaterally  Eyes:  Conjunctivae, EOM and lids are normal. Pupils are equal, round, and reactive to light. Right eye exhibits no discharge. Left eye exhibits no discharge. No scleral icterus.  Neck: Trachea normal, normal range of motion and phonation normal. Neck supple. No tracheal tenderness present. No tracheal deviation present.  Cardiovascular: Normal rate, regular rhythm, S1 normal, S2 normal, normal heart sounds, intact distal pulses and normal pulses.  Exam reveals no gallop, no distant heart sounds and no friction rub.   No murmur heard. Pulmonary/Chest: Effort normal and breath sounds normal. No stridor. No respiratory distress. She has no decreased breath sounds. She has no wheezes. She has no rhonchi. She has no rales. She exhibits no tenderness.  Abdominal: Soft. Bowel sounds are normal. She exhibits no distension, no abdominal bruit and no mass. There is no hepatosplenomegaly. There is tenderness in the right upper quadrant and epigastric area. There is positive Murphy's sign. There is no rigidity, no rebound, no guarding and no CVA tenderness. No hernia.  Pulsatile distance of aorta was about 4cm with palpation above umbilicus.  Lymphadenopathy:  No tenderness or LAD.  Neurological: She is alert and oriented to person, place, and time. She has normal strength. No cranial nerve deficit or sensory deficit. Gait normal.  Skin: Skin is warm, dry and intact. No rash noted. She is not diaphoretic.  Psychiatric: She has a normal mood and affect. Her speech is normal and behavior is normal. Judgment and thought content normal. Cognition and memory are normal.  Vitals reviewed.  Assessment & Plan:  1. RUQ abdominal pain & epigastric pain -Ordered labs to rule out infection and pancreatitis.  -Ordered ultrasound to R/O cholecystitis or cholelithiasis- US Abdomen Complete; Future - Amylase - Lipase - CBC with Differential  2. Acute maxillary sinusitis, recurrence not specified -Take Allegra OTC (lowest dose  possible) daily. - Take Nasonex as prescribed for inflammation - mometasone (NASONEX) 50 MCG/ACT nasal spray; Place 2 sprays into the nose daily.  Dispense: 17 g; Refill: 1 - Take Prednisone as prescribed for inflammation- predniSONE (DELTASONE) 5 MG tablet; Take 3 tablets PO for 2 days, then take 2 tablets PO for 2 days, then take 1 tablet PO for 3 days.  Dispense: 13 tablet; Refill: 0  3. Chronic tension-type headache, not intractable -Take Mobic as prescribed for pain with food- meloxicam (MOBIC) 7.5 MG tablet; Take 1 tablet (7.5 mg total) by mouth daily. Can take up to 2 tablets daily.  Dispense: 30 tablet; Refill: 1  Will message patient on MyChart with lab and ultrasound results. Discussed medication effects and SE's.  Pt agreed to treatment plan. Please follow up in one month for chronic headaches.  Cai Anfinson, Lise AuerJennifer L, PA-C 10:59 AM [facility]

## 2014-03-23 ENCOUNTER — Encounter: Payer: Self-pay | Admitting: Physician Assistant

## 2014-03-24 ENCOUNTER — Ambulatory Visit (HOSPITAL_COMMUNITY)
Admission: RE | Admit: 2014-03-24 | Discharge: 2014-03-24 | Disposition: A | Discharge status: Home / Self Care | Payer: Managed Care, Other (non HMO) | Source: Ambulatory Visit | Attending: Physician Assistant | Admitting: Physician Assistant

## 2014-03-24 DIAGNOSIS — R1011 Right upper quadrant pain: Secondary | ICD-10-CM | POA: Insufficient documentation

## 2014-03-24 DIAGNOSIS — R1013 Epigastric pain: Secondary | ICD-10-CM

## 2014-04-13 ENCOUNTER — Ambulatory Visit: Payer: Self-pay | Admitting: Physician Assistant

## 2014-04-18 ENCOUNTER — Ambulatory Visit: Payer: Self-pay | Admitting: Physician Assistant

## 2014-04-20 ENCOUNTER — Ambulatory Visit: Payer: Self-pay | Admitting: Physician Assistant

## 2014-05-03 ENCOUNTER — Ambulatory Visit (INDEPENDENT_AMBULATORY_CARE_PROVIDER_SITE_OTHER): Payer: 59 | Admitting: Physician Assistant

## 2014-05-03 ENCOUNTER — Encounter: Payer: Self-pay | Admitting: Physician Assistant

## 2014-05-03 VITALS — BP 118/64 | HR 60 | Temp 98.0°F | Resp 16 | Ht 67.5 in | Wt 138.0 lb

## 2014-05-03 DIAGNOSIS — R51 Headache: Secondary | ICD-10-CM

## 2014-05-03 DIAGNOSIS — R519 Headache, unspecified: Secondary | ICD-10-CM

## 2014-05-03 MED ORDER — VERAPAMIL HCL 40 MG PO TABS: 40.0000 mg | ORAL_TABLET | Freq: Three times a day (TID) | ORAL | Status: DC

## 2014-05-03 MED ORDER — ELETRIPTAN HYDROBROMIDE 40 MG PO TABS: ORAL_TABLET | ORAL | Status: DC

## 2014-05-03 NOTE — Progress Notes (Signed)
HPI  A Caucasian 59 y.o.female presents to the office today due to follow up for chronic headaches and ear pain.  Patient was last seen on 03/21/14 and was prescribed mobic.  She states the mobic is not helping her headaches.  She has seen headache clinic before and they try her off caffeine and there was no difference.  Patient had headaches in the past and they have subsided for years, but had reoccurring headaches since beginning of December 2015.  The pain is located mostly bilaterally in parietal/temporal scalp area and the pain is 7 out of 10 right now.  She states "her head feels too heavy for her neck".  The pain will last for hours.  She has tried OTC pain medications with no relief.  The pain has gotten worse in the past 2-3 days and goes to her right jaw, ear and neck.  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  She is currently on Xanax for anxiety and Wellbutrin for depression and states the medications are working.  She reports ear pain, photophobia, sometimes hearing sensitivity and neck pain.  She denies fever, chills sweats, fatigue, congestion, sinus pressure, rhinorrhea, post-nasal drip, sore throat, cough, SOB, CP, abdominal pain, nausea, vomiting, diarrhea, constipation, dizziness, lightheadedness.    Review of Systems  All other systems reviewed and are negative.  Past Medical History-  Past Medical History  Diagnosis Date  . IBS (irritable bowel syndrome)   . Labile hypertension   . Vitamin D deficiency   . Depression   . Chronic tension headaches    Medications-  Current Outpatient Prescriptions on File Prior to Visit  Medication Sig Dispense Refill  . ALPRAZolam (XANAX) 0.5 MG tablet Take 0.5 mg by mouth as needed for anxiety.    Marland Kitchen. buPROPion (WELLBUTRIN XL) 300 MG 24 hr tablet Take 300 mg by mouth daily.    . Cholecalciferol (VITAMIN D PO) Take 3,000 Units by mouth daily.    Marland Kitchen. dicyclomine (BENTYL) 20 MG tablet TAKE 1 TABLET BY MOUTH FOUR TIMES DAILY BEFORE A  MEAL AND AT BEDTIME 120 tablet 0  . meloxicam (MOBIC) 7.5 MG tablet Take 1 tablet (7.5 mg total) by mouth daily. Can take up to 2 tablets daily. 30 tablet 1  . MINIVELLE 0.1 MG/24HR patch     . mometasone (NASONEX) 50 MCG/ACT nasal spray Place 2 sprays into the nose daily. 17 g 1   No current facility-administered medications on file prior to visit.   Allergies-  Allergies  Allergen Reactions  . Luvox [Fluvoxamine] Nausea And Vomiting  . Sulfa Antibiotics Rash   Physical Exam BP 118/64 mmHg  Pulse 60  Temp(Src) 98 F (36.7 C) (Temporal)  Resp 16  Ht 5' 7.5" (1.715 m)  Wt 138 lb (62.596 kg)  BMI 21.28 kg/m2 Wt Readings from Last 3 Encounters:  05/03/14 138 lb (62.596 kg)  03/21/14 140 lb (63.504 kg)  02/02/14 139 lb 12.8 oz (63.413 kg)  Vitals Reviewed. General Appearance: Well nourished, in no apparent distress and had pleasant demeanor. Eyes:  PERRLA. EOMI. Conjunctiva is pink without edema, erythema or yellowing.  No scleral icterus. Sinuses: No Frontal/maxillary tenderness Ears: No erythema, edema or tenderness on both external ear cartilages and ear canals.  TMs are intact bilaterally with normal light reflexes and bulging with clear fluid.  TMs non-erythematous and non-edematous bilaterally. Nose: Nose is symmetrical and turbinates are pink and not erythematous, edematous or  pale.    No polyps, tenderness or  rhinorrhea. Throat: Oral pharynx is pink and moist.  No erythema, edema or tenderness in pharynx or posterior pharynx Mucosa is intact and without lesions. Tonsils are at +1 station bilaterally and do not have exudate.    Uvula is midline and not swollen. No TMJ pain due to prior TMJ arthroplasty.   Neck: Supple,  JVD,  carotid bruits,  LAD, trachea is midline.  Full range of motion in neck intact Respiratory: CTAB,  r/r/w or stridor. No increased effort of breathing. Cardio: RRR.   m/r/g.  S1S2nl.   Abdomen: Symmetrical, soft, nontender, and flat.  +BS nl x4.    Extremities:  C/C/E in upper and lower extremities. Pulses B/L +2  Skin: Warm, dry, intact without rashes, lesions, ecchymosis, yellowing, cyanosis.  Neuro: Alert and oriented X3, cooperative.  Mood and affect appropriate to situation.  CN II-XII grossly intact.  Normal gait.   Psych: Insight and Judgment appropriate.   Assessment and Plan 1. Chronic nonintractable headache, unspecified headache type - eletriptan (RELPAX) 40 MG tablet; One tablet by mouth at onset of headache. May repeat in 2 hours if headache persists or recurs. No more than 2 tablets in 24 hour period  Dispense: 10 tablet; Refill: 0 Was going to start patient on Verapamil, but changed mind after sending in prescription.  Patient has h/o RBBB on EKG from 02/02/14.  Lurena Joiner called the pharmacy to cancel prescription.  Patient agreed that she only wants to try one medication at a time.    Discussed medication effects and SE's.  Pt agreed to treatment plan. Please follow up in 1 month for med recheck.  Landynn Dupler, Lise Auer, PA-C 3:22 PM [facility]

## 2014-05-03 NOTE — Patient Instructions (Addendum)
-  Take relpax for severe headaches only.    Please follow up in 1 month. Please make sure you are drinking water to stay hydrated.  Migraine Headache A migraine headache is an intense, throbbing pain on one or both sides of your head. A migraine can last for 30 minutes to several hours. CAUSES  The exact cause of a migraine headache is not always known. However, a migraine may be caused when nerves in the brain become irritated and release chemicals that cause inflammation. This causes pain. Certain things may also trigger migraines, such as:  Alcohol.  Smoking.  Stress.  Menstruation.  Aged cheeses.  Foods or drinks that contain nitrates, glutamate, aspartame, or tyramine.  Lack of sleep.  Chocolate.  Caffeine.  Hunger.  Physical exertion.  Fatigue.  Medicines used to treat chest pain (nitroglycerine), birth control pills, estrogen, and some blood pressure medicines. SIGNS AND SYMPTOMS  Pain on one or both sides of your head.  Pulsating or throbbing pain.  Severe pain that prevents daily activities.  Pain that is aggravated by any physical activity.  Nausea, vomiting, or both.  Dizziness.  Pain with exposure to bright lights, loud noises, or activity.  General sensitivity to bright lights, loud noises, or smells. Before you get a migraine, you may get warning signs that a migraine is coming (aura). An aura may include:  Seeing flashing lights.  Seeing bright spots, halos, or zigzag lines.  Having tunnel vision or blurred vision.  Having feelings of numbness or tingling.  Having trouble talking.  Having muscle weakness. DIAGNOSIS  A migraine headache is often diagnosed based on:  Symptoms.  Physical exam.  A CT scan or MRI of your head. These imaging tests cannot diagnose migraines, but they can help rule out other causes of headaches. TREATMENT Medicines may be given for pain and nausea. Medicines can also be given to help prevent recurrent  migraines.  HOME CARE INSTRUCTIONS  Only take over-the-counter or prescription medicines for pain or discomfort as directed by your health care provider. The use of long-term narcotics is not recommended.  Lie down in a dark, quiet room when you have a migraine.  Keep a journal to find out what may trigger your migraine headaches. For example, write down:  What you eat and drink.  How much sleep you get.  Any change to your diet or medicines.  Limit alcohol consumption.  Quit smoking if you smoke.  Get 7-9 hours of sleep, or as recommended by your health care provider.  Limit stress.  Keep lights dim if bright lights bother you and make your migraines worse. SEEK IMMEDIATE MEDICAL CARE IF:   Your migraine becomes severe.  You have a fever.  You have a stiff neck.  You have vision loss.  You have muscular weakness or loss of muscle control.  You start losing your balance or have trouble walking.  You feel faint or pass out.  You have severe symptoms that are different from your first symptoms. MAKE SURE YOU:   Understand these instructions.  Will watch your condition.  Will get help right away if you are not doing well or get worse. Document Released: 04/01/2005 Document Revised: 08/16/2013 Document Reviewed: 12/07/2012 [facility] Patient Information 2015 [place], [place]. This information is not intended to replace advice given to you by your health care provider. Make sure you discuss any questions you have with your health care provider.

## 2014-06-14 ENCOUNTER — Ambulatory Visit: Payer: Self-pay | Admitting: Internal Medicine

## 2014-08-10 ENCOUNTER — Encounter: Payer: Self-pay | Admitting: Internal Medicine

## 2014-08-10 MED ORDER — BUTALBITAL-APAP-CAFFEINE 50-500-40 MG PO TABS: 1.0000 | ORAL_TABLET | Freq: Four times a day (QID) | ORAL | Status: DC | PRN

## 2014-08-22 ENCOUNTER — Other Ambulatory Visit (HOSPITAL_COMMUNITY): Payer: Self-pay | Admitting: Obstetrics & Gynecology

## 2014-08-23 LAB — CYTOLOGY - PAP

## 2014-12-30 ENCOUNTER — Ambulatory Visit
Admission: EM | Admit: 2014-12-30 | Discharge: 2014-12-30 | Disposition: A | Discharge status: Home / Self Care | Payer: 59 | Attending: Family Medicine | Admitting: Family Medicine

## 2014-12-30 ENCOUNTER — Encounter: Payer: Self-pay | Admitting: Emergency Medicine

## 2014-12-30 DIAGNOSIS — N12 Tubulo-interstitial nephritis, not specified as acute or chronic: Secondary | ICD-10-CM

## 2014-12-30 DIAGNOSIS — N159 Renal tubulo-interstitial disease, unspecified: Secondary | ICD-10-CM

## 2014-12-30 LAB — URINALYSIS COMPLETE WITH MICROSCOPIC (ARMC ONLY)
Bilirubin Urine: NEGATIVE
Glucose, UA: NEGATIVE mg/dL
Nitrite: NEGATIVE
PH: 6 (ref 5.0–8.0)
PROTEIN: 100 mg/dL — AB
SQUAMOUS EPITHELIAL / LPF: NONE SEEN — AB
Specific Gravity, Urine: 1.025 (ref 1.005–1.030)

## 2014-12-30 MED ORDER — CIPROFLOXACIN HCL 500 MG PO TABS
500.0000 mg | ORAL_TABLET | Freq: Once | ORAL | Status: AC
  Administered 2014-12-30: 500 mg via ORAL

## 2014-12-30 MED ORDER — CIPROFLOXACIN HCL 500 MG PO TABS: 500.0000 mg | ORAL_TABLET | Freq: Two times a day (BID) | ORAL | Status: DC

## 2014-12-30 MED ORDER — CEFTRIAXONE SODIUM 1 G IJ SOLR
1.0000 g | Freq: Once | INTRAMUSCULAR | Status: AC
  Administered 2014-12-30: 1 g via INTRAMUSCULAR

## 2014-12-30 MED ORDER — PHENAZOPYRIDINE HCL 200 MG PO TABS: 200.0000 mg | ORAL_TABLET | Freq: Three times a day (TID) | ORAL | Status: DC | PRN

## 2014-12-30 NOTE — ED Notes (Signed)
Pt with uti s/s x 1 day

## 2014-12-30 NOTE — Discharge Instructions (Signed)
Pyelonephritis, Adult °Pyelonephritis is a kidney infection. A kidney infection can happen quickly, or it can last for a long time. °HOME CARE  °· Take your medicine (antibiotics) as told. Finish it even if you start to feel better. °· Keep all doctor visits as told. °· Drink enough fluids to keep your pee (urine) clear or pale yellow. °· Only take medicine as told by your doctor. °GET HELP RIGHT AWAY IF:  °· You have a fever or lasting symptoms for more than 2-3 days. °· You have a fever and your symptoms suddenly get worse. °· You cannot take your medicine or drink fluids as told. °· You have chills and shaking. °· You feel very weak or pass out (faint). °· You do not feel better after 2 days. °MAKE SURE YOU: °· Understand these instructions. °· Will watch your condition. °· Will get help right away if you are not doing well or get worse. °Document Released: 05/09/2004 Document Revised: 10/01/2011 Document Reviewed: 09/19/2010 °Exit[employer]® Patient Information ©2015 [employer]. This information is not intended to replace advice given to you by your health care provider. Make sure you discuss any questions you have with your health care provider. ° °

## 2014-12-30 NOTE — ED Provider Notes (Signed)
CSN: [id number]     Arrival date & time 12/30/14  1803 History   First MD Initiated Contact with Patient 12/30/14 1849     Chief Complaint  Patient presents with  . Urinary Tract Infection    Patient reports yesterday started having frequency some mild dysuria but had to get up 3 times last night which she never does. She states that she continued having low abdominal pain and lower back pain all during the day she used a heating pad but did not get much relief for her stomach pain. So she finally came in to be seen and evaluated. (Consider location/radiation/quality/duration/timing/severity/associated sxs/prior Treatment) Patient is a 59 y.o. female presenting with urinary tract infection. The history is provided by the patient. No language interpreter was used.  Urinary Tract Infection Pain quality:  Aching Pain severity:  Moderate Duration:  2 days Timing:  Constant Progression:  Worsening Chronicity:  New Relieved by:  Nothing Ineffective treatments: Heating pad. Urinary symptoms: frequent urination   Urinary symptoms: no foul-smelling urine, no hesitancy and no bladder incontinence   Associated symptoms: fever   Associated symptoms: no abdominal pain, no genital lesions, no nausea and no vaginal discharge   Risk factors: no hx of pyelonephritis, no hx of urolithiasis, no kidney transplant, not pregnant, no recurrent urinary tract infections, no renal cysts, no renal disease, not single kidney, no sexually transmitted infections and no urinary catheter     She reports having a hysterectomy and oophorectomy. She's had a colonoscopy. She's had sounds like a colostomy is some redundant loop of bowels that was causing problems and pain and chronic constipation that was multiple years ago. She reports being essentially healthy and does not smoke. Nurse's notes were reviewed.  Past Medical History  Diagnosis Date  . IBS (irritable bowel syndrome)   . Labile hypertension   . Vitamin D  deficiency   . Depression   . Chronic tension headaches    Past Surgical History  Procedure Laterality Date  . Tubal ligation  1981  . Bilateral temporomandibular joint arthroplasty  1986, 1988  . Hemicolectomy  1991  . Abdominal hysterectomy      total hysterectomy   Family History  Problem Relation Age of Onset  . Cancer Mother     lung  . Cancer Father     colon  . Alzheimer's disease Father    Social History  Substance Use Topics  . Smoking status: Never Smoker   . Smokeless tobacco: Never Used  . Alcohol Use: Yes     Comment: Social   OB History    No data available     Review of Systems  Constitutional: Positive for fever.  Gastrointestinal: Negative for nausea, abdominal pain and abdominal distention.  Genitourinary: Positive for dysuria, frequency and enuresis. Negative for vaginal bleeding, vaginal discharge, difficulty urinating and dyspareunia.  Musculoskeletal: Positive for back pain.  All other systems reviewed and are negative.   Allergies  Luvox and Sulfa antibiotics  Home Medications   Prior to Admission medications   Medication Sig Start Date End Date Taking? Authorizing Provider  ALPRAZolam Prudy Feeler) 0.5 MG tablet Take 0.5 mg by mouth as needed for anxiety.    Historical Provider, MD  buPROPion (WELLBUTRIN XL) 300 MG 24 hr tablet Take 300 mg by mouth daily.    Historical Provider, MD  butalbital-acetaminophen-caffeine (ESGIC PLUS) 50-500-40 MG per tablet Take 1 tablet by mouth every 6 (six) hours as needed for pain. 08/10/14   Quentin Mulling, PA-C  Cholecalciferol (VITAMIN D PO) Take 3,000 Units by mouth daily.    Historical Provider, MD  dicyclomine (BENTYL) 20 MG tablet TAKE 1 TABLET BY MOUTH FOUR TIMES DAILY BEFORE A MEAL AND AT BEDTIME 09/22/13   Quentin Mulling, PA-C  eletriptan (RELPAX) 40 MG tablet One tablet by mouth at onset of headache. May repeat in 2 hours if headache persists or recurs. No more than 2 tablets in 24 hour period 05/03/14    Victorino Dike Couillard, PA-C  meloxicam (MOBIC) 7.5 MG tablet Take 1 tablet (7.5 mg total) by mouth daily. Can take up to 2 tablets daily. 03/21/14 03/21/15  Jennifer Couillard, PA-C  MINIVELLE 0.1 MG/24HR patch  01/02/14   Historical Provider, MD  mometasone (NASONEX) 50 MCG/ACT nasal spray Place 2 sprays into the nose daily. 03/21/14 03/21/15  Jennifer Couillard, PA-C   Meds Ordered and Administered this Visit   Medications  cefTRIAXone (ROCEPHIN) injection 1 g (1 g Intramuscular Given 12/30/14 1921)  ciprofloxacin (CIPRO) tablet 500 mg (500 mg Oral Given 12/30/14 1921)    BP 132/55 mmHg  Pulse 88  Temp(Src) 100 F (37.8 C) (Tympanic)  Resp 18  Ht  (1.753 m)  Wt 139 lb (63.05 kg)  BMI 20.52 kg/m2  SpO2 99% No data found.   Physical Exam  Constitutional: She is oriented to person, place, and time. She appears well-developed and well-nourished.  HENT:  Head: Normocephalic and atraumatic.  Eyes: Conjunctivae are normal. Pupils are equal, round, and reactive to light.  Neck: Normal range of motion.  Abdominal: Soft. Bowel sounds are normal. She exhibits no distension. There is no tenderness.  Musculoskeletal: Normal range of motion.  Neurological: She is alert and oriented to person, place, and time.  Skin: Skin is warm and dry. No erythema.  Psychiatric: She has a normal mood and affect.  Vitals reviewed.   ED Course  Procedures (including critical care time)  Labs Review Labs Reviewed  URINALYSIS COMPLETEWITH MICROSCOPIC ([facility] ONLY) - Abnormal; Notable for the following:    APPearance TURBID (*)    Ketones, ur 1+ (*)    Hgb urine dipstick 3+ (*)    Protein, ur 100 (*)    Leukocytes, UA 3+ (*)    Bacteria, UA MANY (*)    Squamous Epithelial / LPF NONE SEEN (*)    All other components within normal limits  URINE CULTURE    Imaging Review No results found.   Visual Acuity Review  Right Eye Distance:   Left Eye Distance:   Bilateral Distance:    Right Eye  Near:   Left Eye Near:    Bilateral Near:         MDM   1. Pyelonephritis   2. Kidney infection      With a fever up to 101 with treat for pyelonephritis. Gram Rocephin IM. Will give her a dose of Cipro now since it is after 7 and some of the pharmacies will be close. We'll place her on Pyridium 200 mg 2 times a day Cipro 500 mg twice day for the next 7 days. He recommends follow-up with her doctor in 1-2 weeks since we have to call the pyelonephritis.  Hassan Rowan, MD 12/30/14 [phone]

## 2015-01-01 ENCOUNTER — Ambulatory Visit
Admission: EM | Admit: 2015-01-01 | Discharge: 2015-01-01 | Disposition: A | Discharge status: Home / Self Care | Payer: 59 | Attending: Family Medicine | Admitting: Family Medicine

## 2015-01-01 ENCOUNTER — Ambulatory Visit: Payer: 59

## 2015-01-01 ENCOUNTER — Encounter: Payer: Self-pay | Admitting: Emergency Medicine

## 2015-01-01 DIAGNOSIS — Z79899 Other long term (current) drug therapy: Secondary | ICD-10-CM | POA: Diagnosis not present

## 2015-01-01 DIAGNOSIS — N12 Tubulo-interstitial nephritis, not specified as acute or chronic: Secondary | ICD-10-CM | POA: Insufficient documentation

## 2015-01-01 DIAGNOSIS — R1011 Right upper quadrant pain: Secondary | ICD-10-CM | POA: Diagnosis not present

## 2015-01-01 DIAGNOSIS — R509 Fever, unspecified: Secondary | ICD-10-CM | POA: Diagnosis present

## 2015-01-01 DIAGNOSIS — R100 Acute abdomen: Secondary | ICD-10-CM

## 2015-01-01 DIAGNOSIS — F329 Major depressive disorder, single episode, unspecified: Secondary | ICD-10-CM | POA: Insufficient documentation

## 2015-01-01 DIAGNOSIS — B3731 Acute candidiasis of vulva and vagina: Secondary | ICD-10-CM

## 2015-01-01 DIAGNOSIS — E559 Vitamin D deficiency, unspecified: Secondary | ICD-10-CM | POA: Diagnosis not present

## 2015-01-01 DIAGNOSIS — R51 Headache: Secondary | ICD-10-CM | POA: Diagnosis present

## 2015-01-01 DIAGNOSIS — Z9049 Acquired absence of other specified parts of digestive tract: Secondary | ICD-10-CM | POA: Insufficient documentation

## 2015-01-01 DIAGNOSIS — R109 Unspecified abdominal pain: Secondary | ICD-10-CM

## 2015-01-01 DIAGNOSIS — K589 Irritable bowel syndrome without diarrhea: Secondary | ICD-10-CM | POA: Insufficient documentation

## 2015-01-01 DIAGNOSIS — B373 Candidiasis of vulva and vagina: Secondary | ICD-10-CM | POA: Diagnosis not present

## 2015-01-01 DIAGNOSIS — M549 Dorsalgia, unspecified: Secondary | ICD-10-CM | POA: Diagnosis present

## 2015-01-01 LAB — CBC WITH DIFFERENTIAL/PLATELET
Basophils Absolute: 0 10*3/uL (ref 0–0.1)
Basophils Relative: 0 %
EOS ABS: 0.1 10*3/uL (ref 0–0.7)
EOS PCT: 1 %
HCT: 37.4 % (ref 35.0–47.0)
HEMOGLOBIN: 12.5 g/dL (ref 12.0–16.0)
LYMPHS ABS: 0.8 10*3/uL — AB (ref 1.0–3.6)
Lymphocytes Relative: 7 %
MCH: 29.7 pg (ref 26.0–34.0)
MCHC: 33.4 g/dL (ref 32.0–36.0)
MCV: 88.9 fL (ref 80.0–100.0)
MONO ABS: 1.2 10*3/uL — AB (ref 0.2–0.9)
MONOS PCT: 11 %
NEUTROS PCT: 81 %
Neutro Abs: 8.8 10*3/uL — ABNORMAL HIGH (ref 1.4–6.5)
Platelets: 197 10*3/uL (ref 150–440)
RBC: 4.21 MIL/uL (ref 3.80–5.20)
RDW: 13.3 % (ref 11.5–14.5)
WBC: 10.9 10*3/uL (ref 3.6–11.0)

## 2015-01-01 LAB — COMPREHENSIVE METABOLIC PANEL
ALBUMIN: 3.7 g/dL (ref 3.5–5.0)
ALK PHOS: 51 U/L (ref 38–126)
ALT: 31 U/L (ref 14–54)
ANION GAP: 2 — AB (ref 5–15)
AST: 35 U/L (ref 15–41)
BILIRUBIN TOTAL: 0.5 mg/dL (ref 0.3–1.2)
BUN: 11 mg/dL (ref 6–20)
CALCIUM: 9.6 mg/dL (ref 8.9–10.3)
CO2: 33 mmol/L — ABNORMAL HIGH (ref 22–32)
Chloride: 106 mmol/L (ref 101–111)
Creatinine, Ser: 0.76 mg/dL (ref 0.44–1.00)
GFR calc Af Amer: >60 mL/min (ref >60)
GFR calc non Af Amer: >60 mL/min (ref >60)
GLUCOSE: 119 mg/dL — AB (ref 65–99)
Potassium: 5.6 mmol/L — ABNORMAL HIGH (ref 3.5–5.1)
Sodium: 141 mmol/L (ref 135–145)
TOTAL PROTEIN: 6.9 g/dL (ref 6.5–8.1)

## 2015-01-01 LAB — AMYLASE: Amylase: 38 U/L (ref 28–100)

## 2015-01-01 LAB — URINALYSIS COMPLETE WITH MICROSCOPIC (ARMC ONLY)
BILIRUBIN URINE: NEGATIVE
GLUCOSE, UA: NEGATIVE mg/dL
NITRITE: NEGATIVE
PH: 7 (ref 5.0–8.0)
Protein, ur: 30 mg/dL — AB
SPECIFIC GRAVITY, URINE: 1.02 (ref 1.005–1.030)

## 2015-01-01 LAB — URINE CULTURE
Culture: >=100000
Special Requests: NORMAL

## 2015-01-01 LAB — LIPASE, BLOOD: Lipase: 20 U/L — ABNORMAL LOW (ref 22–51)

## 2015-01-01 LAB — SEDIMENTATION RATE: SED RATE: 57 mm/h — AB (ref 0–30)

## 2015-01-01 MED ORDER — CEFIXIME 200 MG/5ML PO SUSR: 400.0000 mg | Freq: Every day | ORAL | Status: DC

## 2015-01-01 MED ORDER — FLUCONAZOLE 150 MG PO TABS: 150.0000 mg | ORAL_TABLET | Freq: Once | ORAL | Status: DC

## 2015-01-01 MED ORDER — IOHEXOL 350 MG/ML SOLN
100.0000 mL | Freq: Once | INTRAVENOUS | Status: AC | PRN
  Administered 2015-01-01: 100 mL via INTRAVENOUS

## 2015-01-01 NOTE — Discharge Instructions (Signed)
Pyelonephritis, Adult Pyelonephritis is a kidney infection. A kidney infection can happen quickly, or it can last for a long time. HOME CARE   Take your medicine (antibiotics) as told. Finish it even if you start to feel better.  Keep all doctor visits as told.  Drink enough fluids to keep your pee (urine) clear or pale yellow.  Only take medicine as told by your doctor. GET HELP RIGHT AWAY IF:   You have a fever or lasting symptoms for more than 2-3 days.  You have a fever and your symptoms suddenly get worse.  You cannot take your medicine or drink fluids as told.  You have chills and shaking.  You feel very weak or pass out (faint).  You do not feel better after 2 days. MAKE SURE YOU:  Understand these instructions.  Will watch your condition.  Will get help right away if you are not doing well or get worse. Document Released: 05/09/2004 Document Revised: 10/01/2011 Document Reviewed: 09/19/2010 [facility] Patient Information 2015 [place], [place]. This information is not intended to replace advice given to you by your health care provider. Make sure you discuss any questions you have with your health care provider.  Abdominal Pain Many things can cause belly (abdominal) pain. Most times, the belly pain is not dangerous. Many cases of belly pain can be watched and treated at home. HOME CARE   Do not take medicines that help you go poop (laxatives) unless told to by your doctor.  Only take medicine as told by your doctor.  Eat or drink as told by your doctor. Your doctor will tell you if you should be on a special diet. GET HELP IF:  You do not know what is causing your belly pain.  You have belly pain while you are sick to your stomach (nauseous) or have runny poop (diarrhea).  You have pain while you pee or poop.  Your belly pain wakes you up at night.  You have belly pain that gets worse or better when you eat.  You have belly pain that gets worse when you eat  fatty foods.  You have a fever. GET HELP RIGHT AWAY IF:   The pain does not go away within 2 hours.  You keep throwing up (vomiting).  The pain changes and is only in the right or left part of the belly.  You have bloody or tarry looking poop. MAKE SURE YOU:   Understand these instructions.  Will watch your condition.  Will get help right away if you are not doing well or get worse. Document Released: 09/18/2007 Document Revised: 04/06/2013 Document Reviewed: 12/09/2012 [facility] Patient Information 2015 [place], [place]. This information is not intended to replace advice given to you by your health care provider. Make sure you discuss any questions you have with your health care provider.

## 2015-01-01 NOTE — ED Provider Notes (Signed)
CSN: [id number]     Arrival date & time 01/01/15  1308 History   First MD Initiated Contact with Patient 01/01/15 1022     Chief Complaint  Patient presents with  . Back Pain  . Headache  . Fever   patient was seen Friday treated for pyelonephritis since she had a fever. She is given a shot of Rocephin and Cipro. She feels Cipro is causing problems because she has a right-sided headache now. She states that she gets headaches before this is a worse headache he had but she is also concerned she still having pain is gone from the right flank to more right upper abdomen. (Consider location/radiation/quality/duration/timing/severity/associated sxs/prior Treatment) Patient is a 59 y.o. female presenting with back pain, headaches, and fever. The history is provided by the patient. No language interpreter was used.  Back Pain Quality:  Aching Pain severity:  Moderate Duration:  2 days Timing:  Constant Progression:  Worsening Chronicity:  New Context: not emotional stress, not falling, not jumping from heights, not lifting heavy objects, not MCA, not MVA, not occupational injury, not pedestrian accident, not physical stress, not recent illness, not recent injury and not twisting   Relieved by:  Nothing Worsened by:  Nothing tried Ineffective treatments:  None tried Associated symptoms: abdominal pain, fever and headaches   Associated symptoms: no weakness   Abdominal pain:    Location:  R flank and RUQ Fever:    Timing:  Sporadic   Max temp PTA (F):  100 Risk factors: not obese, not pregnant and no steroid use   Headache Associated symptoms: abdominal pain, back pain and fever   Associated symptoms: no weakness   Fever Associated symptoms: headaches     Past Medical History  Diagnosis Date  . IBS (irritable bowel syndrome)   . Labile hypertension   . Vitamin D deficiency   . Depression   . Chronic tension headaches    Past Surgical History  Procedure Laterality Date  . Tubal  ligation  1981  . Bilateral temporomandibular joint arthroplasty  1986, 1988  . Hemicolectomy  1991  . Abdominal hysterectomy      total hysterectomy   Family History  Problem Relation Age of Onset  . Cancer Mother     lung  . Cancer Father     colon  . Alzheimer's disease Father    Social History  Substance Use Topics  . Smoking status: Never Smoker   . Smokeless tobacco: Never Used  . Alcohol Use: Yes     Comment: Social   OB History    No data available     Review of Systems  Constitutional: Positive for fever.  Gastrointestinal: Positive for abdominal pain.  Musculoskeletal: Positive for back pain.  Neurological: Positive for headaches. Negative for weakness.  All other systems reviewed and are negative.   Allergies  Luvox and Sulfa antibiotics  Home Medications   Prior to Admission medications   Medication Sig Start Date End Date Taking? Authorizing Provider  ALPRAZolam Prudy Feeler) 0.5 MG tablet Take 0.5 mg by mouth as needed for anxiety.    Historical Provider, MD  buPROPion (WELLBUTRIN XL) 300 MG 24 hr tablet Take 300 mg by mouth daily.    Historical Provider, MD  butalbital-acetaminophen-caffeine (ESGIC PLUS) 50-500-40 MG per tablet Take 1 tablet by mouth every 6 (six) hours as needed for pain. 08/10/14   Quentin Mulling, PA-C  cefixime (SUPRAX) 200 MG/5ML suspension Take 10 mLs (400 mg total) by mouth daily. Suprax  400 mg a day. Total dosage. She may have 200 mg  Chewable tablet #20, or 400 mg capsule daily #10 01/01/15   Hassan Rowan, MD  Cholecalciferol (VITAMIN D PO) Take 3,000 Units by mouth daily.    Historical Provider, MD  ciprofloxacin (CIPRO) 500 MG tablet Take 1 tablet (500 mg total) by mouth 2 (two) times daily. 12/30/14   Hassan Rowan, MD  dicyclomine (BENTYL) 20 MG tablet TAKE 1 TABLET BY MOUTH FOUR TIMES DAILY BEFORE A MEAL AND AT BEDTIME 09/22/13   Quentin Mulling, PA-C  eletriptan (RELPAX) 40 MG tablet One tablet by mouth at onset of headache. May repeat in  2 hours if headache persists or recurs. No more than 2 tablets in 24 hour period 05/03/14   Victorino Dike Couillard, PA-C  fluconazole (DIFLUCAN) 150 MG tablet Take 1 tablet (150 mg total) by mouth once. 01/01/15   Hassan Rowan, MD  meloxicam (MOBIC) 7.5 MG tablet Take 1 tablet (7.5 mg total) by mouth daily. Can take up to 2 tablets daily. 03/21/14 03/21/15  Jennifer Couillard, PA-C  MINIVELLE 0.1 MG/24HR patch  01/02/14   Historical Provider, MD  mometasone (NASONEX) 50 MCG/ACT nasal spray Place 2 sprays into the nose daily. 03/21/14 03/21/15  Jennifer Couillard, PA-C  phenazopyridine (PYRIDIUM) 200 MG tablet Take 1 tablet (200 mg total) by mouth 3 (three) times daily as needed for pain. 12/30/14   Hassan Rowan, MD   Meds Ordered and Administered this Visit   Medications  iohexol (OMNIPAQUE) 350 MG/ML injection 100 mL (100 mLs Intravenous Contrast Given 01/01/15 1259)    BP 121/56 mmHg  Pulse 81  Temp(Src) 99.8 F (37.7 C) (Oral)  Resp 20  Ht  (1.727 m)  Wt 136 lb (61.689 kg)  BMI 20.68 kg/m2  SpO2 100% No data found.   Physical Exam  Constitutional: She is oriented to person, place, and time. She appears well-developed and well-nourished.  HENT:  Head: Normocephalic and atraumatic.  Right Ear: External ear normal.  Left Ear: External ear normal.  Eyes: Conjunctivae are normal. Pupils are equal, round, and reactive to light.  Neck: Normal range of motion. Neck supple.  Cardiovascular: Normal rate and regular rhythm.   Pulmonary/Chest: Effort normal and breath sounds normal. No respiratory distress. She has no wheezes.  Abdominal: Soft. Normal appearance and bowel sounds are normal. She exhibits no distension. There is no hepatosplenomegaly. There is tenderness in the right upper quadrant. There is no guarding and no CVA tenderness. No hernia. Hernia confirmed negative in the ventral area.  Musculoskeletal: Normal range of motion. She exhibits no edema.  Lymphadenopathy:    She has no  cervical adenopathy.  Neurological: She is alert and oriented to person, place, and time. No cranial nerve deficit.  Skin: Skin is warm and dry. No erythema.  Psychiatric: She has a normal mood and affect.  Vitals reviewed.   ED Course  Procedures (including critical care time)  Labs Review Labs Reviewed  URINALYSIS COMPLETEWITH MICROSCOPIC ([facility] ONLY) - Abnormal; Notable for the following:    APPearance HAZY (*)    Ketones, ur TRACE (*)    Hgb urine dipstick TRACE (*)    Protein, ur 30 (*)    Leukocytes, UA 1+ (*)    Bacteria, UA FEW (*)    Squamous Epithelial / LPF 6-30 (*)    All other components within normal limits  CBC WITH DIFFERENTIAL/PLATELET - Abnormal; Notable for the following:    Neutro Abs 8.8 (*)  Lymphs Abs 0.8 (*)    Monocytes Absolute 1.2 (*)    All other components within normal limits  SEDIMENTATION RATE - Abnormal; Notable for the following:    Sed Rate 57 (*)    All other components within normal limits  LIPASE, BLOOD - Abnormal; Notable for the following:    Lipase 20 (*)    All other components within normal limits  COMPREHENSIVE METABOLIC PANEL - Abnormal; Notable for the following:    Potassium 5.6 (*)    CO2 33 (*)    Glucose, Bld 119 (*)    Anion gap 2 (*)    All other components within normal limits  AMYLASE    Imaging Review Ct Abdomen Pelvis W Contrast  01/01/2015   CLINICAL DATA:  Right upper quadrant abdominal pain. History of prior colectomy and cholecystectomy. Associated right flank pain.  EXAM: CT ABDOMEN AND PELVIS WITH CONTRAST  TECHNIQUE: Multidetector CT imaging of the abdomen and pelvis was performed using the standard protocol following bolus administration of intravenous contrast.  CONTRAST:  OMNIPAQUE IOHEXOL 350 MG/ML SOLN  COMPARISON:  Abdominal ultrasound dated 03/24/2014  FINDINGS: Lower chest:  Unremarkable.  Hepatobiliary: No masses or other significant abnormality identified. The gallbladder is present and normal  appearing.  Pancreas: No evidence of mass, inflammatory changes, or other significant abnormality.  Spleen:  Within normal limits in size and appearance.  Adrenal Glands:  No masses identified.  Kidneys/Urinary Tract: No evidence of urolithiasis or hydronephrosis. No solid or complex cystic renal masses identified. There are 2 wedge-shaped areas of hypoattenuation in the lateral and posterior cortex of the right kidney, involving the entire thickness of the cortex. There is a less defined hypoattenuation of the superior pole of the left kidney, which does not extend all the way to the outer cortex. No masses or calculi seen involving the lower urinary tract. The urinary bladder is very distended causing secondary mild hydroureter bilaterally. There are bilateral extrarenal pelvises.  Stomach/Bowel/Peritoneum: No evidence of wall thickening, mass, or obstruction. Has been a prior right partial colectomy.  Vascular/Lymphatic: No pathologically enlarged lymph nodes identified. No other significant abnormality noted.  Reproductive: No masses or other significant abnormality identified. The uterus is small or surgically absent.  Other:  None.  Musculoskeletal:  No suspicious bone lesions identified.  IMPRESSION: Areas of hypoattenuation within the bilateral renal cortices, which are most consistent with pyelonephritis. No evidence of abscess formation.  Bilateral hydroureter, likely secondary to significant distention of the urinary bladder.  No evidence of cholecystitis.  Partial right colonic resection.   Electronically Signed   By: Ted Mcalpine M.D.   On: 01/01/2015 13:28   Results for orders placed or performed during the hospital encounter of 01/01/15  Urinalysis complete, with microscopic  Result Value Ref Range   Color, Urine YELLOW YELLOW   APPearance HAZY (A) CLEAR   Glucose, UA NEGATIVE NEGATIVE mg/dL   Bilirubin Urine NEGATIVE NEGATIVE   Ketones, ur TRACE (A) NEGATIVE mg/dL   Specific Gravity,  Urine 1.020 1.005 - 1.030   Hgb urine dipstick TRACE (A) NEGATIVE   pH 7.0 5.0 - 8.0   Protein, ur 30 (A) NEGATIVE mg/dL   Nitrite NEGATIVE NEGATIVE   Leukocytes, UA 1+ (A) NEGATIVE   RBC / HPF 0-5 <3 RBC/hpf   WBC, UA 6-30 <3 WBC/hpf   Bacteria, UA FEW (A) RARE   Squamous Epithelial / LPF 6-30 (A) RARE   Budding Yeast PRESENT    Granular Casts, UA PRESENT  CBC with Differential  Result Value Ref Range   WBC 10.9 3.6 - 11.0 K/uL   RBC 4.21 3.80 - 5.20 MIL/uL   Hemoglobin 12.5 12.0 - 16.0 g/dL   HCT 37.4 09.6 16.1 04.5 %   MCV 88.9 80.0 - 100.0 fL   MCH 29.7 26.0 - 34.0 pg   MCHC 33.4 32.0 - 36.0 g/dL   RDW 40.9 81.1 - 91.4 %   Platelets 197 150 - 440 K/uL   Neutrophils Relative % 81 %   Neutro Abs 8.8 (H) 1.4 - 6.5 K/uL   Lymphocytes Relative 7 %   Lymphs Abs 0.8 (L) 1.0 - 3.6 K/uL   Monocytes Relative 11 %   Monocytes Absolute 1.2 (H) 0.2 - 0.9 K/uL   Eosinophils Relative 1 %   Eosinophils Absolute 0.1 0 - 0.7 K/uL   Basophils Relative 0 %   Basophils Absolute 0.0 0 - 0.1 K/uL  Sedimentation rate  Result Value Ref Range   Sed Rate 57 (H) 0 - 30 mm/hr  Amylase  Result Value Ref Range   Amylase 38 28 - 100 U/L  Lipase, blood  Result Value Ref Range   Lipase 20 (L) 22 - 51 U/L  Comprehensive metabolic panel  Result Value Ref Range   Sodium 141 135 - 145 mmol/L   Potassium 5.6 (H) 3.5 - 5.1 mmol/L   Chloride 106 101 - 111 mmol/L   CO2 33 (H) 22 - 32 mmol/L   Glucose, Bld 119 (H) 65 - 99 mg/dL   BUN 11 6 - 20 mg/dL   Creatinine, Ser 7.82 0.44 - 1.00 mg/dL   Calcium 9.6 8.9 - 95.6 mg/dL   Total Protein 6.9 6.5 - 8.1 g/dL   Albumin 3.7 3.5 - 5.0 g/dL   AST 35 15 - 41 U/L   ALT 31 14 - 54 U/L   Alkaline Phosphatase 51 38 - 126 U/L   Total Bilirubin 0.5 0.3 - 1.2 mg/dL   GFR calc non Af Amer >60 >60 mL/min   GFR calc Af Amer >60 >60 mL/min   Anion gap 2 (L) 5 - 15    Visual Acuity Review  Right Eye Distance:   Left Eye Distance:   Bilateral Distance:     Right Eye Near:   Left Eye Near:    Bilateral Near:         MDM   1. Abdominal pain, acute   2. Pyelonephritis   3. Yeast vaginitis     Patient has CT scan which showed consistent with pyelonephritis. Also lab work amylase CBC lipase essentially negative counseling 10,000 now measures were made been on Friday. Per patient request we'll stop the Cipro though I did explain to the patient the Cipro had best sensitivity for this infection since she received an injection of Rocephin here on Friday we'll place on prescription of Suprax with coupons 400 mg total a day for 10 days. Reiterated that the pyelonephritis is a systemic infection that is beyond a simple kidney infection. An infection like this is going to cause her to feel fatigue tired and not well but subsequently take a couple days for the anabiotic's to work. Reck which follow-up with her PCP in a week since the lab tech did see some yeast in the urine will treat with Diflucan as well.  Hassan Rowan, MD 01/01/15 [phone]

## 2015-01-01 NOTE — ED Notes (Signed)
IV #20G removed from Left AC, placed by radiology, catheter intact. Bandage applied.

## 2015-01-01 NOTE — ED Notes (Signed)
Patient presents here with c/o rt sided flank pain and back pain and headache associted with fever since Friday, was seen by the physician and was prescribed cipro for the Polynephritis , but still has the symptoms, had fever 100 this am and took tylenol 1 hr ago

## 2015-01-03 ENCOUNTER — Other Ambulatory Visit: Payer: Self-pay | Admitting: Internal Medicine

## 2015-02-13 ENCOUNTER — Encounter: Payer: Self-pay | Admitting: Internal Medicine

## 2015-02-13 ENCOUNTER — Ambulatory Visit (INDEPENDENT_AMBULATORY_CARE_PROVIDER_SITE_OTHER): Payer: 59 | Admitting: Internal Medicine

## 2015-02-13 VITALS — BP 118/70 | HR 76 | Temp 97.0°F | Resp 16 | Ht 67.75 in | Wt 134.6 lb

## 2015-02-13 DIAGNOSIS — Z79899 Other long term (current) drug therapy: Secondary | ICD-10-CM

## 2015-02-13 DIAGNOSIS — R7309 Other abnormal glucose: Secondary | ICD-10-CM

## 2015-02-13 DIAGNOSIS — Z1212 Encounter for screening for malignant neoplasm of rectum: Secondary | ICD-10-CM

## 2015-02-13 DIAGNOSIS — Z111 Encounter for screening for respiratory tuberculosis: Secondary | ICD-10-CM

## 2015-02-13 DIAGNOSIS — Z23 Encounter for immunization: Secondary | ICD-10-CM

## 2015-02-13 DIAGNOSIS — R5383 Other fatigue: Secondary | ICD-10-CM

## 2015-02-13 DIAGNOSIS — N390 Urinary tract infection, site not specified: Secondary | ICD-10-CM

## 2015-02-13 DIAGNOSIS — E559 Vitamin D deficiency, unspecified: Secondary | ICD-10-CM

## 2015-02-13 DIAGNOSIS — Z0000 Encounter for general adult medical examination without abnormal findings: Secondary | ICD-10-CM

## 2015-02-13 DIAGNOSIS — Z0001 Encounter for general adult medical examination with abnormal findings: Secondary | ICD-10-CM

## 2015-02-13 DIAGNOSIS — E782 Mixed hyperlipidemia: Secondary | ICD-10-CM

## 2015-02-13 DIAGNOSIS — I10 Essential (primary) hypertension: Secondary | ICD-10-CM

## 2015-02-13 DIAGNOSIS — R0989 Other specified symptoms and signs involving the circulatory and respiratory systems: Secondary | ICD-10-CM

## 2015-02-13 DIAGNOSIS — G44229 Chronic tension-type headache, not intractable: Secondary | ICD-10-CM

## 2015-02-13 DIAGNOSIS — Z6820 Body mass index (BMI) 20.0-20.9, adult: Secondary | ICD-10-CM | POA: Insufficient documentation

## 2015-02-13 LAB — CBC WITH DIFFERENTIAL/PLATELET
BASOS PCT: 1 % (ref 0–1)
Basophils Absolute: 0.1 10*3/uL (ref 0.0–0.1)
Eosinophils Absolute: 0.2 10*3/uL (ref 0.0–0.7)
Eosinophils Relative: 3 % (ref 0–5)
HEMATOCRIT: 37.4 % (ref 36.0–46.0)
HEMOGLOBIN: 12.9 g/dL (ref 12.0–15.0)
LYMPHS PCT: 24 % (ref 12–46)
Lymphs Abs: 1.8 10*3/uL (ref 0.7–4.0)
MCH: 29.5 pg (ref 26.0–34.0)
MCHC: 34.5 g/dL (ref 30.0–36.0)
MCV: 85.4 fL (ref 78.0–100.0)
MPV: 9.7 fL (ref 8.6–12.4)
Monocytes Absolute: 0.5 10*3/uL (ref 0.1–1.0)
Monocytes Relative: 7 % (ref 3–12)
NEUTROS ABS: 5 10*3/uL (ref 1.7–7.7)
NEUTROS PCT: 65 % (ref 43–77)
Platelets: 288 10*3/uL (ref 150–400)
RBC: 4.38 MIL/uL (ref 3.87–5.11)
RDW: 13.8 % (ref 11.5–15.5)
WBC: 7.7 10*3/uL (ref 4.0–10.5)

## 2015-02-13 LAB — BASIC METABOLIC PANEL WITH GFR
BUN: 12 mg/dL (ref 7–25)
CHLORIDE: 99 mmol/L (ref 98–110)
CO2: 31 mmol/L (ref 20–31)
Calcium: 9 mg/dL (ref 8.6–10.4)
Creat: 0.77 mg/dL (ref 0.50–0.99)
GFR, EST NON AFRICAN AMERICAN: 84 mL/min (ref >=60)
GFR, Est African American: >89 mL/min (ref >=60)
Glucose, Bld: 87 mg/dL (ref 65–99)
POTASSIUM: 4.1 mmol/L (ref 3.5–5.3)
SODIUM: 137 mmol/L (ref 135–146)

## 2015-02-13 LAB — HEPATIC FUNCTION PANEL
ALK PHOS: 42 U/L (ref 33–130)
ALT: 18 U/L (ref 6–29)
AST: 24 U/L (ref 10–35)
Albumin: 4.2 g/dL (ref 3.6–5.1)
BILIRUBIN DIRECT: 0.1 mg/dL (ref <=0.2)
BILIRUBIN INDIRECT: 0.2 mg/dL (ref 0.2–1.2)
Total Bilirubin: 0.3 mg/dL (ref 0.2–1.2)
Total Protein: 6.6 g/dL (ref 6.1–8.1)

## 2015-02-13 LAB — LIPID PANEL
CHOL/HDL RATIO: 2.8 ratio (ref <=5.0)
CHOLESTEROL: 182 mg/dL (ref 125–200)
HDL: 64 mg/dL (ref >=46)
LDL CALC: 99 mg/dL (ref <130)
Triglycerides: 93 mg/dL (ref <150)
VLDL: 19 mg/dL (ref <30)

## 2015-02-13 LAB — IRON AND TIBC
%SAT: 34 % (ref 11–50)
IRON: 100 ug/dL (ref 45–160)
TIBC: 295 ug/dL (ref 250–450)
UIBC: 195 ug/dL (ref 125–400)

## 2015-02-13 LAB — TSH: TSH: 1.15 u[IU]/mL (ref 0.350–4.500)

## 2015-02-13 LAB — VITAMIN B12: VITAMIN B 12: 902 pg/mL (ref 211–911)

## 2015-02-13 LAB — MAGNESIUM: Magnesium: 2.3 mg/dL (ref 1.5–2.5)

## 2015-02-13 NOTE — Progress Notes (Signed)
Patient ID: Briana Charles, female   DOB: 12-Jul-1955, 59 y.o.   MRN: [id number]   Comprehensive Examination  This very nice 59 y.o. MWF presents for presents for a Wellness/Preventative Visit & comprehensive evaluation and management of multiple medical co-morbidities.  Patient has been followed for labile HTN, screened for Prediabetes &  Hyperlipidemia and Vitamin D Deficiency. Patient also has long hx/o IBS and occasionally uses Bentyl 2-3 x/month. She also reports occasional tension or muscle contraction type HA.     Patient has many years hx/o Labile HTN circa 2000.  Patient's BP has been controlled and patient denies any cardiac symptoms as chest pain, palpitations, shortness of breath, dizziness or ankle swelling. Today's BP: 118/70 mmHg    Patient's Lipids are controlled with diet.  Patient denies myalgias or other medication SE's. Last lipids were  Total Chol 167, HDL 73, Trig 55 and LDL 83- all at goal in Oct 2015.    Patient is actively monitored expectantly for prediabetes and patient denies reactive hypoglycemic symptoms, visual blurring, diabetic polys, or paresthesias. Last A1c was 5.6% in Oct 2015.     Finally, patient has history of Vitamin D Deficiency and last Vitamin D was 92 in Oct 2015.      Medication Sig  . ALPRAZolam  0.5 MG tablet Take 0.5 mg by mouth as needed for anxiety.  Marland Kitchen buPROPion-XL 300 MG 24 hr tablet Take 300 mg by mouth daily.  Marland Kitchen  ESGIC PLUS) 50-500-40 MG  Take 1 tablet by mouth every 6 (six) hours as needed for pain.  Marland Kitchen VITAMIN D  Take 3,000 Units by mouth daily.  Marland Kitchen dicyclomine  20 MG tablet TAKE 1 TABLET BY MOUTH FOUR TIMES DAILY BEFORE A MEAL AND AT BEDTIME  . MINIVELLE 0.1 MG/24HR patch    Allergies  Allergen Reactions  . Luvox [Fluvoxamine] Nausea And Vomiting  . Sulfa Antibiotics Rash   Past Medical History  Diagnosis Date  . IBS (irritable bowel syndrome)   . Labile hypertension   . Vitamin D deficiency   . Depression   . Chronic tension  headaches    Health Maintenance  Topic Date Due  . MAMMOGRAM  08/11/2009  . INFLUENZA VACCINE  11/14/2014  . ZOSTAVAX  12/12/2014  . PAP SMEAR  08/21/2017  . TETANUS/TDAP  01/11/2020  . COLONOSCOPY  02/05/2021  . Hepatitis C Screening  Completed  . HIV Screening  Completed   Immunization History  Administered Date(s) Administered  . Influenza Split 02/02/2014  . PPD Test 02/02/2014  . Pneumococcal-Unspecified 01/27/2012  . Tdap 01/10/2010   Past Surgical History  Procedure Laterality Date  . Tubal ligation  1981  . Bilateral temporomandibular joint arthroplasty  1986, 1988  . Hemicolectomy  1991  . Abdominal hysterectomy      total hysterectomy   Family History  Problem Relation Age of Onset  . Cancer Mother     lung  . Cancer Father     colon  . Alzheimer's disease Father    Social History  Substance Use Topics  . Smoking status: Never Smoker   . Smokeless tobacco: Never Used  . Alcohol Use: Yes     Comment: Social    ROS Constitutional: Denies fever, chills, weight loss/gain, headaches, insomnia,  night sweats, and change in appetite. Does c/o fatigue. Eyes: Denies redness, blurred vision, diplopia, discharge, itchy, watery eyes.  ENT: Denies discharge, congestion, post nasal drip, epistaxis, sore throat, earache, hearing loss, dental pain, Tinnitus, Vertigo, Sinus pain, snoring.  Cardio: Denies chest pain, palpitations, irregular heartbeat, syncope, dyspnea, diaphoresis, orthopnea, PND, claudication, edema Respiratory: denies cough, dyspnea, DOE, pleurisy, hoarseness, laryngitis, wheezing.  Gastrointestinal: Denies dysphagia, heartburn, reflux, water brash, pain, cramps, nausea, vomiting, bloating, diarrhea, constipation, hematemesis, melena, hematochezia, jaundice, hemorrhoids Genitourinary: Denies dysuria, frequency, urgency, nocturia, hesitancy, discharge, hematuria, flank pain Breast: Breast lumps, nipple discharge, bleeding.  Musculoskeletal: Denies  arthralgia, myalgia, stiffness, Jt. Swelling, pain, limp, and strain/sprain. Denies falls. Skin: Denies puritis, rash, hives, warts, acne, eczema, changing in skin lesion Neuro: No weakness, tremor, incoordination, spasms, paresthesia, pain Psychiatric: Denies confusion, memory loss, sensory loss. Denies Depression. Endocrine: Denies change in weight, skin, hair change, nocturia, and paresthesia, diabetic polys, visual blurring, hyper / hypo glycemic episodes.  Heme/Lymph: No excessive bleeding, bruising, enlarged lymph nodes.  Physical Exam  BP 118/70 mmHg  Pulse 76  Temp(Src) 97 F (36.1 C)  Resp 16  Ht 5' 7.75" (1.721 m)  Wt 134 lb 9.6 oz (61.054 kg)  BMI 20.61 kg/m2  General Appearance: Well nourished and in no apparent distress. Eyes: PERRLA, EOMs, conjunctiva no swelling or erythema, normal fundi and vessels. Sinuses: No frontal/maxillary tenderness ENT/Mouth: EACs patent / TMs  nl. Nares clear without erythema, swelling, mucoid exudates. Oral hygiene is good. No erythema, swelling, or exudate. Tongue normal, non-obstructing. Tonsils not swollen or erythematous. Hearing normal.  Neck: Supple, thyroid normal. No bruits, nodes or JVD. Respiratory: Respiratory effort normal.  BS equal and clear bilateral without rales, rhonci, wheezing or stridor. Cardio: Heart sounds are normal with regular rate and rhythm and no murmurs, rubs or gallops. Peripheral pulses are normal and equal bilaterally without edema. No aortic or femoral bruits. Chest: symmetric with normal excursions and percussion. Breasts: Deferred to Dr Jennette KettleNeal. (is up to date w/MGM & bone densities in his office) Abdomen: Flat, soft, with bowel sounds. Nontender, no guarding, rebound, hernias, masses, or organomegaly.  Lymphatics: Non tender without lymphadenopathy.  Genitourinary: Deferred to Dr Jennette KettleNeal  Musculoskeletal: Full ROM all peripheral extremities, joint stability, 5/5 strength, and normal gait. Skin: Warm and dry  without rashes, lesions, cyanosis, clubbing or  ecchymosis.  Neuro: Cranial nerves intact, reflexes equal bilaterally. Normal muscle tone, no cerebellar symptoms. Sensation intact.  Pysch: Alert and oriented X 3, normal affect, Insight and Judgment appropriate.   Assessment and Plan  1. Encounter for general adult medical examination with abnormal findings  - Microalbumin / creatinine urine ratio - EKG 12-Lead - US, RETROPERITNL ABD,  LTD - POC Hemoccult Bld/Stl  - Vitamin B12 - Iron and TIBC - Urinalysis, Routine w reflex microscopic  - CBC with Differential/Platelet - BASIC METABOLIC PANEL WITH GFR - Hepatic function panel - Magnesium - Lipid panel - TSH - Hemoglobin A1c - Insulin, random - Vit D  25 hydroxy (rtn osteoporosis monitoring)  2. Labile hypertension  - Microalbumin / creatinine urine ratio - EKG 12-Lead - US, RETROPERITNL ABD,  LTD - TSH  3. Mixed hyperlipidemia  - Lipid panel  4. Abnormal glucose  - Hemoglobin A1c - Insulin, random  5. Vitamin D deficiency  - Vit D  25 hydroxy   6. Chronic tension-type headache, not intractable   7. Screening for rectal cancer  - POC Hemoccult Bld/Stl (  8. Other fatigue  - Vitamin B12 - Iron and TIBC - TSH  9. Medication management  - Urinalysis, Routine w reflex microscopic - CBC with Differential/Platelet - BASIC METABOLIC PANEL WITH GFR - Hepatic function panel - Magnesium  10. Body mass index (BMI) of 20.0-20.9  in adult   36. Urinary tract infection without hematuria, site unspecified  - Urine culture  12. Screening examination for pulmonary tuberculosis  - PPD  13. Need for prophylactic vaccination and inoculation against influenza  - Flu vaccine > 3yo with preservative IM (Fluvirin Influenza Split)   Continue prudent diet as discussed, weight control, BP monitoring, regular exercise, and medications. Discussed med's effects and SE's. Screening labs and tests as requested with  regular follow-up as recommended.  Over 40 minutes of exam, counseling, chart review was performed.

## 2015-02-13 NOTE — Patient Instructions (Signed)
Recommend Adult Low Dose Aspirin or   coated  Aspirin 81 mg daily   To reduce risk of Colon Cancer 20 %,   Skin Cancer 26 % ,   Melanoma 46%   and   Pancreatic cancer 60%   ++++++++++++++++++++++++++++++++++++++++++++++++++++++  Vitamin D goal   is between 70-100.   Please make sure that you are taking your Vitamin D as directed.   It is very important as a natural anti-inflammatory   helping hair, skin, and nails, as well as reducing stroke and heart attack risk.   It helps your bones and helps with mood.  It also decreases numerous cancer risks so please take it as directed.   Low Vit D is associated with a 200-300% higher risk for CANCER   and 200-300% higher risk for HEART   ATTACK  &  STROKE.   ......................................  It is also associated with higher death rate at younger ages,   autoimmune diseases like Rheumatoid arthritis, Lupus, Multiple Sclerosis.     Also many other serious conditions, like depression, Alzheimer's  Dementia, infertility, muscle aches, fatigue, fibromyalgia - just to name a few.  ++++++++++++++++++++++++++++++++++++++++++++++++  Recommend the book "The END of DIETING" by Dr Joel Fuhrman   & the book "The END of DIABETES " by Dr Joel Fuhrman  At [employer] - get book & Audio CD's     Being diabetic has a  300% increased risk for heart attack, stroke, cancer, and alzheimer- type vascular dementia. It is very important that you work harder with diet by avoiding all foods that are white. Avoid white rice (brown & wild rice is OK), white potatoes (sweetpotatoes in moderation is OK), White bread or wheat bread or anything made out of white flour like bagels, donuts, rolls, buns, biscuits, cakes, pastries, cookies, pizza crust, and pasta (made from white flour & egg whites) - vegetarian pasta or spinach or wheat pasta is OK. Multigrain breads like Arnold's or [employer], or multigrain sandwich thins or flatbreads.  Diet,  exercise and weight loss can reverse and cure diabetes in the early stages.  Diet, exercise and weight loss is very important in the control and prevention of complications of diabetes which affects every system in your body, ie. Brain - dementia/stroke, eyes - glaucoma/blindness, heart - heart attack/heart failure, kidneys - dialysis, stomach - gastric paralysis, intestines - malabsorption, nerves - severe painful neuritis, circulation - gangrene & loss of a leg(s), and finally cancer and Alzheimers.    I recommend avoid fried & greasy foods,  sweets/candy, white rice (brown or wild rice or Quinoa is OK), white potatoes (sweet potatoes are OK) - anything made from white flour - bagels, doughnuts, rolls, buns, biscuits,white and wheat breads, pizza crust and traditional pasta made of white flour & egg white(vegetarian pasta or spinach or wheat pasta is OK).  Multi-grain bread is OK - like multi-grain flat bread or sandwich thins. Avoid alcohol in excess. Exercise is also important.    Eat all the vegetables you want - avoid meat, especially red meat and dairy - especially cheese.  Cheese is the most concentrated form of trans-fats which is the worst thing to clog up our arteries. Veggie cheese is OK which can be found in the fresh produce section at [employer] or [employer] or [employer]  ++++++++++++++++++++++++++++++++++++++++++++++++++ DASH Eating Plan  DASH stands for "Dietary Approaches to Stop Hypertension."   The DASH eating plan is a healthy eating plan that has been shown to reduce high   blood pressure (hypertension). Additional health benefits may include reducing the risk of type 2 diabetes mellitus, heart disease, and stroke. The DASH eating plan may also help with weight loss.  WHAT DO I NEED TO KNOW ABOUT THE DASH EATING PLAN? For the DASH eating plan, you will follow these general guidelines:  Choose foods with a percent daily value for sodium of less than 5% (as listed on the food  label).  Use salt-free seasonings or herbs instead of table salt or sea salt.  Check with your health care provider or pharmacist before using salt substitutes.  Eat lower-sodium products, often labeled as "lower sodium" or "no salt added."  Eat fresh foods.  Eat more vegetables, fruits, and low-fat dairy products.    Choose whole grains. Look for the word "whole" as the first word in the ingredient list.  Choose fish   Limit sweets, desserts, sugars, and sugary drinks.  Choose heart-healthy fats.  Eat veggie cheese   Eat more home-cooked food and less restaurant, buffet, and fast food.  Limit fried foods.  Cook foods using methods other than frying.  Limit canned vegetables. If you do use them, rinse them well to decrease the sodium.  When eating at a restaurant, ask that your food be prepared with less salt, or no salt if possible.                      WHAT FOODS CAN I EAT?  Seek help from a dietitian for individual calorie needs. Grains Whole grain or whole wheat bread. Brown rice. Whole grain or whole wheat pasta. Quinoa, bulgur, and whole grain cereals. Low-sodium cereals. Corn or whole wheat flour tortillas. Whole grain cornbread. Whole grain crackers. Low-sodium crackers.  Vegetables Fresh or frozen vegetables (raw, steamed, roasted, or grilled). Low-sodium or reduced-sodium tomato and vegetable juices. Low-sodium or reduced-sodium tomato sauce and paste. Low-sodium or reduced-sodium canned vegetables.   Fruits All fresh, canned (in natural juice), or frozen fruits.  Meat and Other Protein Products  All fish and seafood.  Dried beans, peas, or lentils. Unsalted nuts and seeds. Unsalted canned beans. Dairy Low-fat dairy products, such as skim or 1% milk, 2% or reduced-fat cheeses, low-fat ricotta or cottage cheese, or plain low-fat yogurt. Low-sodium or reduced-sodium cheeses.  Fats and Oils Tub margarines without trans fats. Light or reduced-fat mayonnaise  and salad dressings (reduced sodium). Avocado. Safflower, olive, or canola oils. Natural peanut or almond butter.  Other Unsalted popcorn and pretzels. The items listed above may not be a complete list of recommended foods or beverages. Contact your dietitian for more options.  +++++++++++++++++++++++++++++++++++++++++++  WHAT FOODS ARE NOT RECOMMENDED?  Grains/ White flour or wheat flour  White bread. White pasta. White rice. Refined cornbread. Bagels and croissants. Crackers that contain trans fat.  Vegetables  Creamed or fried vegetables. Vegetables in a . Regular canned vegetables. Regular canned tomato sauce and paste. Regular tomato and vegetable juices.  Fruits Dried fruits. Canned fruit in light or heavy syrup. Fruit juice.  Meat and Other Protein Products Meat in general. Fatty cuts of meat. Ribs, chicken wings, bacon, sausage, bologna, salami, chitterlings, fatback, hot dogs, bratwurst, and packaged luncheon meats. Salted nuts and seeds. Canned beans with salt.  Dairy Whole or 2% milk, cream, half-and-half, and cream cheese. Whole-fat or sweetened yogurt. Full-fat cheeses or blue cheese. Nondairy creamers and whipped toppings. Processed cheese, cheese spreads, or cheese curds.  Condiments Onion and garlic salt, seasoned salt, table salt, and sea  salt. Canned and packaged gravies. Worcestershire sauce. Tartar sauce. Barbecue sauce. Teriyaki sauce. Soy sauce, including reduced sodium. Steak sauce. Fish sauce. Oyster sauce. Cocktail sauce. Horseradish. Ketchup and mustard. Meat flavorings and tenderizers. Bouillon cubes. Hot sauce. Tabasco sauce. Marinades. Taco seasonings. Relishes.  Fats and Oils Butter, stick margarine, lard, shortening, ghee, and bacon fat. Coconut, palm kernel, or palm oils. Regular salad dressings.  Pickles and olives. Salted popcorn and pretzels. The items listed above may not be a complete list of foods and beverages to avoid.   Preventive Care for  Adults  A healthy lifestyle and preventive care can promote health and wellness. Preventive health guidelines for women include the following key practices.  A routine yearly physical is a good way to check with your health care provider about your health and preventive screening. It is a chance to share any concerns and updates on your health and to receive a thorough exam.  Visit your dentist for a routine exam and preventive care every 6 months. Brush your teeth twice a day and floss once a day. Good oral hygiene prevents tooth decay and gum disease.  The frequency of eye exams is based on your age, health, family medical history, use of contact lenses, and other factors. Follow your health care provider's recommendations for frequency of eye exams.  Eat a healthy diet. Foods like vegetables, fruits, whole grains, low-fat dairy products, and lean protein foods contain the nutrients you need without too many calories. Decrease your intake of foods high in solid fats, added sugars, and salt. Eat the right amount of calories for you.Get information about a proper diet from your health care provider, if necessary.  Regular physical exercise is one of the most important things you can do for your health. Most adults should get at least 150 minutes of moderate-intensity exercise (any activity that increases your heart rate and causes you to sweat) each week. In addition, most adults need muscle-strengthening exercises on 2 or more days a week.  Maintain a healthy weight. The body mass index (BMI) is a screening tool to identify possible weight problems. It provides an estimate of body fat based on height and weight. Your health care provider can find your BMI and can help you achieve or maintain a healthy weight.For adults 20 years and older:  A BMI below 18.5 is considered underweight.  A BMI of 18.5 to 24.9 is normal.  A BMI of 25 to 29.9 is considered overweight.  A BMI of 30 and above is  considered obese.  Maintain normal blood lipids and cholesterol levels by exercising and minimizing your intake of saturated fat. Eat a balanced diet with plenty of fruit and vegetables. Blood tests for lipids and cholesterol should begin at age 27 and be repeated every 5 years. If your lipid or cholesterol levels are high, you are over 50, or you are at high risk for heart disease, you may need your cholesterol levels checked more frequently.Ongoing high lipid and cholesterol levels should be treated with medicines if diet and exercise are not working.  If you smoke, find out from your health care provider how to quit. If you do not use tobacco, do not start.  Lung cancer screening is recommended for adults aged 38-80 years who are at high risk for developing lung cancer because of a history of smoking. A yearly low-dose CT scan of the lungs is recommended for people who have at least a 30-pack-year history of smoking and are a current  smoker or have quit within the past 15 years. A pack year of smoking is smoking an average of 1 pack of cigarettes a day for 1 year (for example: 1 pack a day for 30 years or 2 packs a day for 15 years). Yearly screening should continue until the smoker has stopped smoking for at least 15 years. Yearly screening should be stopped for people who develop a health problem that would prevent them from having lung cancer treatment.  High blood pressure causes heart disease and increases the risk of stroke. Your blood pressure should be checked at least every 1 to 2 years. Ongoing high blood pressure should be treated with medicines if weight loss and exercise do not work.  If you are 93-63 years old, ask your health care provider if you should take aspirin to prevent strokes.  Diabetes screening involves taking a blood sample to check your fasting blood sugar level. This should be done once every 3 years, after age 49, if you are within normal weight and without risk factors  for diabetes. Testing should be considered at a younger age or be carried out more frequently if you are overweight and have at least 1 risk factor for diabetes.  Breast cancer screening is essential preventive care for women. You should practice "breast self-awareness." This means understanding the normal appearance and feel of your breasts and may include breast self-examination. Any changes detected, no matter how small, should be reported to a health care provider. Women in their 59s and 30s should have a clinical breast exam (CBE) by a health care provider as part of a regular health exam every 1 to 3 years. After age 58, women should have a CBE every year. Starting at age 17, women should consider having a mammogram (breast X-ray test) every year. Women who have a family history of breast cancer should talk to their health care provider about genetic screening. Women at a high risk of breast cancer should talk to their health care providers about having an MRI and a mammogram every year.  Breast cancer gene (BRCA)-related cancer risk assessment is recommended for women who have family members with BRCA-related cancers. BRCA-related cancers include breast, ovarian, tubal, and peritoneal cancers. Having family members with these cancers may be associated with an increased risk for harmful changes (mutations) in the breast cancer genes BRCA1 and BRCA2. Results of the assessment will determine the need for genetic counseling and BRCA1 and BRCA2 testing.  Routine pelvic exams to screen for cancer are no longer recommended for nonpregnant women who are considered low risk for cancer of the pelvic organs (ovaries, uterus, and vagina) and who do not have symptoms. Ask your health care provider if a screening pelvic exam is right for you.  If you have had past treatment for cervical cancer or a condition that could lead to cancer, you need Pap tests and screening for cancer for at least 20 years after your  treatment. If Pap tests have been discontinued, your risk factors (such as having a new sexual partner) need to be reassessed to determine if screening should be resumed. Some women have medical problems that increase the chance of getting cervical cancer. In these cases, your health care provider may recommend more frequent screening and Pap tests.  Colorectal cancer can be detected and often prevented. Most routine colorectal cancer screening begins at the age of 54 years and continues through age 105 years. However, your health care provider may recommend screening at an  earlier age if you have risk factors for colon cancer. On a yearly basis, your health care provider may provide home test kits to check for hidden blood in the stool. Use of a small camera at the end of a tube, to directly examine the colon (sigmoidoscopy or colonoscopy), can detect the earliest forms of colorectal cancer. Talk to your health care provider about this at age 58, when routine screening begins. Direct exam of the colon should be repeated every 5-10 years through age 66 years, unless early forms of pre-cancerous polyps or small growths are found.  Hepatitis C blood testing is recommended for all people born from [birth year] through 1965 and any individual with known risks for hepatitis C.  Pra  Osteoporosis is a disease in which the bones lose minerals and strength with aging. This can result in serious bone fractures or breaks. The risk of osteoporosis can be identified using a bone density scan. Women ages 33 years and over and women at risk for fractures or osteoporosis should discuss screening with their health care providers. Ask your health care provider whether you should take a calcium supplement or vitamin D to reduce the rate of osteoporosis.  Menopause can be associated with physical symptoms and risks. Hormone replacement therapy is available to decrease symptoms and risks. You should talk to your health care provider  about whether hormone replacement therapy is right for you.  Use sunscreen. Apply sunscreen liberally and repeatedly throughout the day. You should seek shade when your shadow is shorter than you. Protect yourself by wearing long sleeves, pants, a wide-brimmed hat, and sunglasses year round, whenever you are outdoors.  Once a month, do a whole body skin exam, using a mirror to look at the skin on your back. Tell your health care provider of new moles, moles that have irregular borders, moles that are larger than a pencil eraser, or moles that have changed in shape or color.  Stay current with required vaccines (immunizations).  Influenza vaccine. All adults should be immunized every year.  Tetanus, diphtheria, and acellular pertussis (Td, Tdap) vaccine. Pregnant women should receive 1 dose of Tdap vaccine during each pregnancy. The dose should be obtained regardless of the length of time since the last dose. Immunization is preferred during the 27th-36th week of gestation. An adult who has not previously received Tdap or who does not know her vaccine status should receive 1 dose of Tdap. This initial dose should be followed by tetanus and diphtheria toxoids (Td) booster doses every 10 years. Adults with an unknown or incomplete history of completing a 3-dose immunization series with Td-containing vaccines should begin or complete a primary immunization series including a Tdap dose. Adults should receive a Td booster every 10 years.  Varicella vaccine. An adult without evidence of immunity to varicella should receive 2 doses or a second dose if she has previously received 1 dose. Pregnant females who do not have evidence of immunity should receive the first dose after pregnancy. This first dose should be obtained before leaving the health care facility. The second dose should be obtained 4-8 weeks after the first dose.  Human papillomavirus (HPV) vaccine. Females aged 13-26 years who have not received  the vaccine previously should obtain the 3-dose series. The vaccine is not recommended for use in pregnant females. However, pregnancy testing is not needed before receiving a dose. If a female is found to be pregnant after receiving a dose, no treatment is needed. In that case, the remaining  doses should be delayed until after the pregnancy. Immunization is recommended for any person with an immunocompromised condition through the age of 61 years if she did not get any or all doses earlier. During the 3-dose series, the second dose should be obtained 4-8 weeks after the first dose. The third dose should be obtained 24 weeks after the first dose and 16 weeks after the second dose.  Zoster vaccine. One dose is recommended for adults aged 39 years or older unless certain conditions are present.  Measles, mumps, and rubella (MMR) vaccine. Adults born before [birth year] generally are considered immune to measles and mumps. Adults born in [birth year] or later should have 1 or more doses of MMR vaccine unless there is a contraindication to the vaccine or there is laboratory evidence of immunity to each of the three diseases. A routine second dose of MMR vaccine should be obtained at least 28 days after the first dose for students attending postsecondary schools, health care workers, or international travelers. People who received inactivated measles vaccine or an unknown type of measles vaccine during 1963-1967 should receive 2 doses of MMR vaccine. People who received inactivated mumps vaccine or an unknown type of mumps vaccine before 1979 and are at high risk for mumps infection should consider immunization with 2 doses of MMR vaccine. For females of childbearing age, rubella immunity should be determined. If there is no evidence of immunity, females who are not pregnant should be vaccinated. If there is no evidence of immunity, females who are pregnant should delay immunization until after pregnancy. Unvaccinated health care  workers born before [birth year] who lack laboratory evidence of measles, mumps, or rubella immunity or laboratory confirmation of disease should consider measles and mumps immunization with 2 doses of MMR vaccine or rubella immunization with 1 dose of MMR vaccine.  Pneumococcal 13-valent conjugate (PCV13) vaccine. When indicated, a person who is uncertain of her immunization history and has no record of immunization should receive the PCV13 vaccine. An adult aged 70 years or older who has certain medical conditions and has not been previously immunized should receive 1 dose of PCV13 vaccine. This PCV13 should be followed with a dose of pneumococcal polysaccharide (PPSV23) vaccine. The PPSV23 vaccine dose should be obtained at least 8 weeks after the dose of PCV13 vaccine. An adult aged 62 years or older who has certain medical conditions and previously received 1 or more doses of PPSV23 vaccine should receive 1 dose of PCV13. The PCV13 vaccine dose should be obtained 1 or more years after the last PPSV23 vaccine dose.    Pneumococcal polysaccharide (PPSV23) vaccine. When PCV13 is also indicated, PCV13 should be obtained first. All adults aged 60 years and older should be immunized. An adult younger than age 26 years who has certain medical conditions should be immunized. Any person who resides in a nursing home or long-term care facility should be immunized. An adult smoker should be immunized. People with an immunocompromised condition and certain other conditions should receive both PCV13 and PPSV23 vaccines. People with human immunodeficiency virus (HIV) infection should be immunized as soon as possible after diagnosis. Immunization during chemotherapy or radiation therapy should be avoided. Routine use of PPSV23 vaccine is not recommended for American Indians, [place] Natives, or people younger than 65 years unless there are medical conditions that require PPSV23 vaccine. When indicated, people who have unknown  immunization and have no record of immunization should receive PPSV23 vaccine. One-time revaccination 5 years after the first dose of PPSV23  is recommended for people aged 19-64 years who have chronic kidney failure, nephrotic syndrome, asplenia, or immunocompromised conditions. People who received 1-2 doses of PPSV23 before age 11 years should receive another dose of PPSV23 vaccine at age 42 years or later if at least 5 years have passed since the previous dose. Doses of PPSV23 are not needed for people immunized with PPSV23 at or after age 43 years.  Preventive Services / Frequency   Ages 63 to 62 years  Blood pressure check.  Lipid and cholesterol check.  Lung cancer screening. / Every year if you are aged 54-80 years and have a 30-pack-year history of smoking and currently smoke or have quit within the past 15 years. Yearly screening is stopped once you have quit smoking for at least 15 years or develop a health problem that would prevent you from having lung cancer treatment.  Clinical breast exam.** / Every year after age 34 years.  BRCA-related cancer risk assessment.** / For women who have family members with a BRCA-related cancer (breast, ovarian, tubal, or peritoneal cancers).  Mammogram.** / Every year beginning at age 49 years and continuing for as long as you are in good health. Consult with your health care provider.  Pap test.** / Every 3 years starting at age 54 years through age 108 or 44 years with a history of 3 consecutive normal Pap tests.  HPV screening.** / Every 3 years from ages 57 years through ages 77 to 59 years with a history of 3 consecutive normal Pap tests.  Fecal occult blood test (FOBT) of stool. / Every year beginning at age 101 years and continuing until age 52 years. You may not need to do this test if you get a colonoscopy every 10 years.  Flexible sigmoidoscopy or colonoscopy.** / Every 5 years for a flexible sigmoidoscopy or every 10 years for a  colonoscopy beginning at age 55 years and continuing until age 54 years.  Hepatitis C blood test.** / For all people born from [birth year] through 1966 and any individual with known risks for hepatitis C.  Skin self-exam. / Monthly.  Influenza vaccine. / Every year.  Tetanus, diphtheria, and acellular pertussis (Tdap/Td) vaccine.** / Consult your health care provider. Pregnant women should receive 1 dose of Tdap vaccine during each pregnancy. 1 dose of Td every 10 years.  Varicella vaccine.** / Consult your health care provider. Pregnant females who do not have evidence of immunity should receive the first dose after pregnancy.  Zoster vaccine.** / 1 dose for adults aged 27 years or older.  Pneumococcal 13-valent conjugate (PCV13) vaccine.** / Consult your health care provider.  Pneumococcal polysaccharide (PPSV23) vaccine.** / 1 to 2 doses if you smoke cigarettes or if you have certain conditions.  Meningococcal vaccine.** / Consult your health care provider.  Hepatitis A vaccine.** / Consult your health care provider.  Hepatitis B vaccine.** / Consult your health care provider. Screening for abdominal aortic aneurysm (AAA)  by ultrasound is recommended for people over 50 who have history of high blood pressure or who are current or former smokers.

## 2015-02-14 LAB — MICROALBUMIN / CREATININE URINE RATIO
Creatinine, Urine: 24 mg/dL (ref 20–320)
Microalb, Ur: <0.2 mg/dL

## 2015-02-14 LAB — URINALYSIS, ROUTINE W REFLEX MICROSCOPIC
Bilirubin Urine: NEGATIVE
GLUCOSE, UA: NEGATIVE
Hgb urine dipstick: NEGATIVE
Ketones, ur: NEGATIVE
LEUKOCYTES UA: NEGATIVE
Nitrite: NEGATIVE
PROTEIN: NEGATIVE
SPECIFIC GRAVITY, URINE: 1.008 (ref 1.001–1.035)
pH: 6 (ref 5.0–8.0)

## 2015-02-14 LAB — HEMOGLOBIN A1C
HEMOGLOBIN A1C: 5.6 % (ref <5.7)
Mean Plasma Glucose: 114 mg/dL (ref <117)

## 2015-02-14 LAB — INSULIN, RANDOM: Insulin: 2.1 u[IU]/mL (ref 2.0–19.6)

## 2015-02-14 LAB — VITAMIN D 25 HYDROXY (VIT D DEFICIENCY, FRACTURES): Vit D, 25-Hydroxy: 76 ng/mL (ref 30–100)

## 2015-02-15 LAB — URINE CULTURE

## 2015-03-13 ENCOUNTER — Ambulatory Visit (INDEPENDENT_AMBULATORY_CARE_PROVIDER_SITE_OTHER): Payer: 59 | Admitting: Internal Medicine

## 2015-03-13 VITALS — BP 102/64 | HR 88 | Temp 98.0°F | Resp 16 | Ht 67.75 in | Wt 139.0 lb

## 2015-03-13 DIAGNOSIS — J0100 Acute maxillary sinusitis, unspecified: Secondary | ICD-10-CM | POA: Diagnosis not present

## 2015-03-13 MED ORDER — FLUTICASONE PROPIONATE 50 MCG/ACT NA SUSP: 2.0000 | Freq: Every day | NASAL | Status: DC

## 2015-03-13 MED ORDER — AZITHROMYCIN 250 MG PO TABS: ORAL_TABLET | ORAL | Status: DC

## 2015-03-13 MED ORDER — DEXAMETHASONE SODIUM PHOSPHATE 100 MG/10ML IJ SOLN
10.0000 mg | Freq: Once | INTRAMUSCULAR | Status: AC
  Administered 2015-03-13: 10 mg via INTRAMUSCULAR

## 2015-03-13 NOTE — Progress Notes (Signed)
Patient ID: Briana Charles, female   DOB: 1955-06-01, 59 y.o.   MRN: [id number]  HPI  Patient presents to the office for evaluation of cough.  It has been going on for 2 weeks.  Patient reports all the time, dry cough.  They also endorse change in voice, postnasal drip and sinus pressure, sore throat, mild ear pain, nasal congestion..  They have tried advil.  They report that nothing has worked.  They denies other sick contacts.   Review of Systems  Constitutional: Positive for malaise/fatigue. Negative for fever and chills.  HENT: Positive for congestion, ear pain and sore throat.   Respiratory: Positive for cough. Negative for shortness of breath and wheezing.   Cardiovascular: Negative for chest pain, palpitations and leg swelling.  Neurological: Positive for headaches.    PE:  Filed Vitals:   03/13/15 1547  BP: 102/64  Pulse: 88  Temp: 98 F (36.7 C)  Resp: 16    General:  Alert and non-toxic, WDWN, NAD HEENT: NCAT, PERLA, EOM normal, no occular discharge or erythema.  Nasal mucosal edema with sinus tenderness to palpation.  Oropharynx clear with minimal oropharyngeal edema and erythema.  Mucous membranes moist and pink. Neck:  Cervical adenopathy Chest:  RRR no MRGs.  Lungs clear to auscultation A&P with no wheezes rhonchi or rales.   Abdomen: +BS x 4 quadrants, soft, non-tender, no guarding, rigidity, or rebound. Skin: warm and dry no rash Neuro: A&Ox4, CN II-XII grossly intact  Assessment and Plan:   1. Acute maxillary sinusitis, recurrence not specified -decadron -zpak -flonase -nasal saline -delsym prn -tylenol or ibuprofen prn

## 2015-03-13 NOTE — Addendum Note (Signed)
Addended by: Quindon Denker A on: 03/13/2015 04:23 PM   Modules accepted: Orders

## 2015-03-13 NOTE — Patient Instructions (Signed)
Please take the zpak until it is gone.  Please use nasal saline as often as you can tolerate.  Take delsym every 12 hours as needed for coughing.  Please use flonase 2 sprays each nostril before bedtime.

## 2015-05-21 ENCOUNTER — Encounter: Payer: Self-pay | Admitting: Internal Medicine

## 2015-05-21 ENCOUNTER — Other Ambulatory Visit: Payer: Self-pay | Admitting: Internal Medicine

## 2015-05-21 DIAGNOSIS — J0140 Acute pansinusitis, unspecified: Secondary | ICD-10-CM

## 2015-05-21 MED ORDER — AZITHROMYCIN 250 MG PO TABS: ORAL_TABLET | ORAL | Status: DC

## 2015-06-12 ENCOUNTER — Other Ambulatory Visit: Payer: Self-pay | Admitting: *Deleted

## 2015-06-12 DIAGNOSIS — Z0001 Encounter for general adult medical examination with abnormal findings: Secondary | ICD-10-CM

## 2015-06-12 DIAGNOSIS — Z1212 Encounter for screening for malignant neoplasm of rectum: Secondary | ICD-10-CM

## 2015-06-12 LAB — POC HEMOCCULT BLD/STL (HOME/3-CARD/SCREEN)
Card #2 Fecal Occult Blod, POC: NEGATIVE
Card #3 Fecal Occult Blood, POC: NEGATIVE
FECAL OCCULT BLD: NEGATIVE

## 2015-06-29 ENCOUNTER — Other Ambulatory Visit: Payer: Self-pay | Admitting: Physician Assistant

## 2016-03-19 ENCOUNTER — Encounter: Payer: Self-pay | Admitting: Internal Medicine

## 2016-03-19 ENCOUNTER — Ambulatory Visit (INDEPENDENT_AMBULATORY_CARE_PROVIDER_SITE_OTHER): Payer: 59 | Admitting: Internal Medicine

## 2016-03-19 VITALS — BP 106/68 | HR 64 | Temp 97.5°F | Resp 16 | Ht 67.25 in | Wt 134.6 lb

## 2016-03-19 DIAGNOSIS — I10 Essential (primary) hypertension: Secondary | ICD-10-CM

## 2016-03-19 DIAGNOSIS — Z111 Encounter for screening for respiratory tuberculosis: Secondary | ICD-10-CM

## 2016-03-19 DIAGNOSIS — Z23 Encounter for immunization: Secondary | ICD-10-CM

## 2016-03-19 DIAGNOSIS — Z0000 Encounter for general adult medical examination without abnormal findings: Secondary | ICD-10-CM | POA: Diagnosis not present

## 2016-03-19 DIAGNOSIS — Z79899 Other long term (current) drug therapy: Secondary | ICD-10-CM

## 2016-03-19 DIAGNOSIS — Z136 Encounter for screening for cardiovascular disorders: Secondary | ICD-10-CM | POA: Diagnosis not present

## 2016-03-19 DIAGNOSIS — R0989 Other specified symptoms and signs involving the circulatory and respiratory systems: Secondary | ICD-10-CM

## 2016-03-19 DIAGNOSIS — G44229 Chronic tension-type headache, not intractable: Secondary | ICD-10-CM

## 2016-03-19 DIAGNOSIS — E782 Mixed hyperlipidemia: Secondary | ICD-10-CM

## 2016-03-19 DIAGNOSIS — R5383 Other fatigue: Secondary | ICD-10-CM

## 2016-03-19 DIAGNOSIS — E559 Vitamin D deficiency, unspecified: Secondary | ICD-10-CM

## 2016-03-19 DIAGNOSIS — Z1212 Encounter for screening for malignant neoplasm of rectum: Secondary | ICD-10-CM

## 2016-03-19 DIAGNOSIS — R7309 Other abnormal glucose: Secondary | ICD-10-CM

## 2016-03-19 DIAGNOSIS — Z0001 Encounter for general adult medical examination with abnormal findings: Secondary | ICD-10-CM

## 2016-03-19 LAB — CBC WITH DIFFERENTIAL/PLATELET
BASOS PCT: 1 %
Basophils Absolute: 49 cells/uL (ref 0–200)
Eosinophils Absolute: 196 cells/uL (ref 15–500)
Eosinophils Relative: 4 %
HEMATOCRIT: 41.5 % (ref 35.0–45.0)
HEMOGLOBIN: 13.6 g/dL (ref 11.7–15.5)
LYMPHS ABS: 1470 {cells}/uL (ref 850–3900)
Lymphocytes Relative: 30 %
MCH: 29.1 pg (ref 27.0–33.0)
MCHC: 32.8 g/dL (ref 32.0–36.0)
MCV: 88.7 fL (ref 80.0–100.0)
MONO ABS: 490 {cells}/uL (ref 200–950)
MPV: 10.1 fL (ref 7.5–12.5)
Monocytes Relative: 10 %
NEUTROS PCT: 55 %
Neutro Abs: 2695 cells/uL (ref 1500–7800)
Platelets: 256 10*3/uL (ref 140–400)
RBC: 4.68 MIL/uL (ref 3.80–5.10)
RDW: 13.6 % (ref 11.0–15.0)
WBC: 4.9 10*3/uL (ref 3.8–10.8)

## 2016-03-19 LAB — BASIC METABOLIC PANEL WITH GFR
BUN: 12 mg/dL (ref 7–25)
CO2: 29 mmol/L (ref 20–31)
Calcium: 9.6 mg/dL (ref 8.6–10.4)
Chloride: 100 mmol/L (ref 98–110)
Creat: 0.84 mg/dL (ref 0.50–0.99)
GFR, EST NON AFRICAN AMERICAN: 75 mL/min (ref >=60)
GFR, Est African American: 87 mL/min (ref >=60)
GLUCOSE: 90 mg/dL (ref 65–99)
POTASSIUM: 4.1 mmol/L (ref 3.5–5.3)
Sodium: 138 mmol/L (ref 135–146)

## 2016-03-19 LAB — IRON AND TIBC
%SAT: 42 % (ref 11–50)
IRON: 139 ug/dL (ref 45–160)
TIBC: 332 ug/dL (ref 250–450)
UIBC: 193 ug/dL (ref 125–400)

## 2016-03-19 LAB — HEMOGLOBIN A1C
HEMOGLOBIN A1C: 5.1 % (ref <5.7)
MEAN PLASMA GLUCOSE: 100 mg/dL

## 2016-03-19 LAB — HEPATIC FUNCTION PANEL
ALK PHOS: 42 U/L (ref 33–130)
ALT: 21 U/L (ref 6–29)
AST: 27 U/L (ref 10–35)
Albumin: 4.5 g/dL (ref 3.6–5.1)
BILIRUBIN INDIRECT: 0.4 mg/dL (ref 0.2–1.2)
Bilirubin, Direct: 0.1 mg/dL (ref <=0.2)
TOTAL PROTEIN: 6.7 g/dL (ref 6.1–8.1)
Total Bilirubin: 0.5 mg/dL (ref 0.2–1.2)

## 2016-03-19 LAB — LIPID PANEL
Cholesterol: 206 mg/dL — ABNORMAL HIGH (ref <200)
HDL: 87 mg/dL (ref >50)
LDL CALC: 101 mg/dL — AB (ref <100)
Total CHOL/HDL Ratio: 2.4 Ratio (ref <5.0)
Triglycerides: 92 mg/dL (ref <150)
VLDL: 18 mg/dL (ref <30)

## 2016-03-19 LAB — TSH: TSH: 1.85 mIU/L

## 2016-03-19 LAB — VITAMIN B12: VITAMIN B 12: 842 pg/mL (ref 200–1100)

## 2016-03-19 LAB — MAGNESIUM: Magnesium: 2 mg/dL (ref 1.5–2.5)

## 2016-03-19 NOTE — Progress Notes (Signed)
[facility] Briana Charles, M.D.    Dyanne CarrelAmanda R. [name], P.A.-C      [name], P.A.-C  [facility]                [address]                [place], [place] [id number] Telephone [phone] Telefax [phone]  Annual Screening/Preventative Visit & Comprehensive Evaluation &  Examination     This very nice 60 y.o. MWF presents for a Screening/Preventative Visit & comprehensive evaluation and management of multiple medical co-morbidities.  Patient has been followed for HTN, Prediabetes, Hyperlipidemia and Vitamin D Deficiency.  Patient also has hx/o IBS and has tapered off of her bentyl - only taking 1 - 2x/month. Patient has long hx/o muscle contraction HA's which have significantly improved and reduced in frequently and only infrequently takes g Fioricet.       Patient has hx/o labile HTN since circa 2000 and is followed expectantly.  Patient's BP has been controlled at home and patient denies any cardiac symptoms as chest pain, palpitations, shortness of breath, dizziness or ankle swelling. Today's BP is at goal - 106/68. Patient still exercises by walking 3-4 miles most days.      Patient's hyperlipidemia is controlled with diet and medications. Patient denies myalgias or other medication SE's. Last lipids were at goal: Lab Results  Component Value Date   CHOL 182 02/13/2015   HDL 64 02/13/2015   LDLCALC 99 02/13/2015   TRIG 93 02/13/2015   CHOLHDL 2.8 02/13/2015      Patient is monitored expectantly for prediabetes and patient denies reactive hypoglycemic symptoms, visual blurring, diabetic polys, or paresthesias. Last A1c was at goal: Lab Results  Component Value Date   HGBA1C 5.6 02/13/2015      Finally, patient has history of Vitamin D Deficiency and last Vitamin D was at goal: Lab Results  Component Value Date   VD25OH 76 02/13/2015   Current Outpatient Prescriptions on File Prior to Visit   Medication Sig  . ALPRAZolam (XANAX) 0.5 MG tablet Take 0.5 mg by mouth as needed for anxiety.  Marland Kitchen. buPROPion (WELLBUTRIN XL) 300 MG 24 hr tablet Take 300 mg by mouth daily.  . Cholecalciferol (VITAMIN D PO) Take 3,000 Units by mouth daily.  Marland Kitchen. MINIVELLE 0.1 MG/24HR patch    No current facility-administered medications on file prior to visit.    Allergies  Allergen Reactions  . Luvox [Fluvoxamine] Nausea And Vomiting  . Sulfa Antibiotics Rash   Past Medical History:  Diagnosis Date  . Chronic tension headaches   . Depression   . IBS (irritable bowel syndrome)   . Labile hypertension   . Vitamin D deficiency    Health Maintenance  Topic Date Due  . MAMMOGRAM  08/11/2009  . ZOSTAVAX  12/12/2014  . INFLUENZA VACCINE  11/14/2015  . PAP SMEAR  08/21/2017  . TETANUS/TDAP  01/11/2020  . COLONOSCOPY  02/05/2021  . Hepatitis C Screening  Completed  . HIV Screening  Completed   Immunization History  Administered Date(s) Administered  . Influenza Split 02/02/2014, 02/13/2015  . PPD Test 02/02/2014, 02/13/2015  . Pneumococcal-Unspecified 01/27/2012  . Tdap 01/10/2010   Past Surgical History:  Procedure Laterality Date  . ABDOMINAL HYSTERECTOMY     total hysterectomy  . BILATERAL TEMPOROMANDIBULAR JOINT ARTHROPLASTY  1986, 1988  . HEMICOLECTOMY  1991  . TUBAL LIGATION  1981   Family History  Problem  Relation Age of Onset  . Cancer Mother     lung  . Cancer Father     colon  . Alzheimer's disease Father    Social History  Substance Use Topics  . Smoking status: Never Smoker  . Smokeless tobacco: Never Used  . Alcohol use Yes     Comment: Social    ROS Constitutional: Denies fever, chills, weight loss/gain, headaches, insomnia,  night sweats, and change in appetite. Does c/o fatigue. Eyes: Denies redness, blurred vision, diplopia, discharge, itchy, watery eyes.  ENT: Denies discharge, congestion, post nasal drip, epistaxis, sore throat, earache, hearing loss, dental  pain, Tinnitus, Vertigo, Sinus pain, snoring.  Cardio: Denies chest pain, palpitations, irregular heartbeat, syncope, dyspnea, diaphoresis, orthopnea, PND, claudication, edema Respiratory: denies cough, dyspnea, DOE, pleurisy, hoarseness, laryngitis, wheezing.  Gastrointestinal: Denies dysphagia, heartburn, reflux, water brash, pain, cramps, nausea, vomiting, bloating, diarrhea, constipation, hematemesis, melena, hematochezia, jaundice, hemorrhoids Genitourinary: Denies dysuria, frequency, urgency, nocturia, hesitancy, discharge, hematuria, flank pain Breast: Breast lumps, nipple discharge, bleeding.  Musculoskeletal: Denies arthralgia, myalgia, stiffness, Jt. Swelling, pain, limp, and strain/sprain. Denies falls. Skin: Denies puritis, rash, hives, warts, acne, eczema, changing in skin lesion Neuro: No weakness, tremor, incoordination, spasms, paresthesia, pain Psychiatric: Denies confusion, memory loss, sensory loss. Denies Depression. Endocrine: Denies change in weight, skin, hair change, nocturia, and paresthesia, diabetic polys, visual blurring, hyper / hypo glycemic episodes.  Heme/Lymph: No excessive bleeding, bruising, enlarged lymph nodes.  Physical Exam  BP 106/68   Pulse 64   Temp 97.5 F (36.4 C)   Resp 16   Ht 5' 7.25" (1.708 m)   Wt 134 lb 9.6 oz (61.1 kg)   BMI 20.92 kg/m   General Appearance: Well nourished and in no apparent distress.  Eyes: PERRLA, EOMs, conjunctiva no swelling or erythema, normal fundi and vessels. Sinuses: No frontal/maxillary tenderness ENT/Mouth: EACs patent / TMs  nl. Nares clear without erythema, swelling, mucoid exudates. Oral hygiene is good. No erythema, swelling, or exudate. Tongue normal, non-obstructing. Tonsils not swollen or erythematous. Hearing normal.  Neck: Supple, thyroid normal. No bruits, nodes or JVD. Respiratory: Respiratory effort normal.  BS equal and clear bilateral without rales, rhonci, wheezing or stridor. Cardio: Heart  sounds are normal with regular rate and rhythm and no murmurs, rubs or gallops. Peripheral pulses are normal and equal bilaterally without edema. No aortic or femoral bruits. Chest: symmetric with normal excursions and percussion. Breasts: Deferred to Dr Jennette Kettle. Abdomen: Flat, soft with bowel sounds active. Nontender, no guarding, rebound, hernias, masses, or organomegaly.  Lymphatics: Non tender without lymphadenopathy.  Genitourinary: Deferred to Dr Jennette Kettle. Musculoskeletal: Full ROM all peripheral extremities, joint stability, 5/5 strength, and normal gait. Skin: Warm and dry without rashes, lesions, cyanosis, clubbing or  ecchymosis.  Neuro: Cranial nerves intact, reflexes equal bilaterally. Normal muscle tone, no cerebellar symptoms. Sensation intact.  Pysch: Alert and oriented X 3, normal affect, Insight and Judgment appropriate.   Assessment and Plan  1. Annual Preventative Screening Examination   2. Labile hypertension  - Microalbumin / creatinine urine ratio - EKG 12-Lead - [place], RETROPERITNL ABD,  LTD - Urinalysis, Routine w reflex microscopic - CBC with Differential/Platelet - BASIC METABOLIC PANEL WITH GFR - TSH  3. Mixed hyperlipidemia  - EKG 12-Lead - [place], RETROPERITNL ABD,  LTD - Lipid panel - TSH  4. Abnormal glucose  - Hemoglobin A1c - Insulin, random  5. Vitamin D deficiency  - VITAMIN D 25 Hydroxy   6. Chronic tension-type headache, not intractable  7. Screening for rectal cancer  - POC Hemoccult Bld/Stl   8. Screening examination for pulmonary tuberculosis  - PPD  9. Screening for ischemic heart disease  - EKG 12-Lead  10. Screening for AAA (aortic abdominal aneurysm)  - US, RETROPERITNL ABD,  LTD  11. Other fatigue  - Vitamin B12 - Iron and TIBC - CBC with Differential/Platelet - BASIC METABOLIC PANEL WITH GFR - Hepatic function panel - TSH  12. Medication management  - Urinalysis, Routine w reflex microscopic - CBC with  Differential/Platelet - BASIC METABOLIC PANEL WITH GFR - Hepatic function panel - Magnesium  13. Need for prophylactic vaccination and inoculation against influenza  - Flu Vaccine QUAD with presevative        Continue prudent diet as discussed, weight control, BP monitoring, regular exercise, and medications. Discussed med's effects and SE's. Screening labs and tests as requested with regular follow-up as recommended. Over 40 minutes of exam, counseling, chart review and high complex critical decision making was performed.

## 2016-03-19 NOTE — Patient Instructions (Signed)

## 2016-03-20 LAB — MICROALBUMIN / CREATININE URINE RATIO
CREATININE, URINE: 14 mg/dL — AB (ref 20–320)
MICROALB UR: 0.2 mg/dL
MICROALB/CREAT RATIO: 14 ug/mg{creat} (ref <30)

## 2016-03-20 LAB — URINALYSIS, ROUTINE W REFLEX MICROSCOPIC
BILIRUBIN URINE: NEGATIVE
Glucose, UA: NEGATIVE
HGB URINE DIPSTICK: NEGATIVE
KETONES UR: NEGATIVE
Leukocytes, UA: NEGATIVE
NITRITE: NEGATIVE
PROTEIN: NEGATIVE
SPECIFIC GRAVITY, URINE: 1.006 (ref 1.001–1.035)
pH: 7 (ref 5.0–8.0)

## 2016-03-20 LAB — INSULIN, RANDOM: Insulin: 2.2 u[IU]/mL (ref 2.0–19.6)

## 2016-03-20 LAB — VITAMIN D 25 HYDROXY (VIT D DEFICIENCY, FRACTURES): Vit D, 25-Hydroxy: 113 ng/mL — ABNORMAL HIGH (ref 30–100)

## 2016-03-21 ENCOUNTER — Encounter: Payer: Self-pay | Admitting: Internal Medicine

## 2016-03-21 LAB — TB SKIN TEST
INDURATION: 0 mm
TB Skin Test: NEGATIVE

## 2016-04-03 ENCOUNTER — Other Ambulatory Visit: Payer: Self-pay | Admitting: Internal Medicine

## 2016-04-03 NOTE — Telephone Encounter (Signed)
Fioricet was called into pharmacy

## 2016-04-10 ENCOUNTER — Encounter: Payer: Self-pay | Admitting: Internal Medicine

## 2016-04-10 ENCOUNTER — Other Ambulatory Visit: Payer: Self-pay | Admitting: Physician Assistant

## 2016-04-10 ENCOUNTER — Ambulatory Visit: Payer: Self-pay | Admitting: Podiatry

## 2016-04-10 MED ORDER — PREDNISONE 20 MG PO TABS: ORAL_TABLET | ORAL | 0 refills | Status: DC

## 2016-04-10 MED ORDER — BENZONATATE 100 MG PO CAPS: 200.0000 mg | ORAL_CAPSULE | Freq: Three times a day (TID) | ORAL | 0 refills | Status: DC | PRN

## 2016-04-13 ENCOUNTER — Other Ambulatory Visit: Payer: Self-pay | Admitting: Internal Medicine

## 2016-04-13 MED ORDER — AZITHROMYCIN 250 MG PO TABS: ORAL_TABLET | ORAL | 0 refills | Status: DC

## 2016-05-05 IMAGING — CT CT ABD-PELV W/ CM
2 of 5 series · 16 of 46 positions shown, 18 images · IV contrast (omnipaque)
Comparison: Abdominal ultrasound dated 03/24/2014

CLINICAL DATA: Right upper quadrant abdominal pain. History of
prior colectomy and cholecystectomy. Associated right flank pain.

EXAM:
CT ABDOMEN AND PELVIS WITH CONTRAST
TECHNIQUE: Multidetector CT imaging of the abdomen and pelvis was performed
using the standard protocol following bolus administration of
intravenous contrast.
CONTRAST:  100mL OMNIPAQUE IOHEXOL 350 MG/ML SOLN

[Series 2: axial soft tissue · axial · 0.61mm/px · z∈[-811,-391]mm · 13 of 94 slices shown, 15 images]
[im 5/94  soft-tissue]
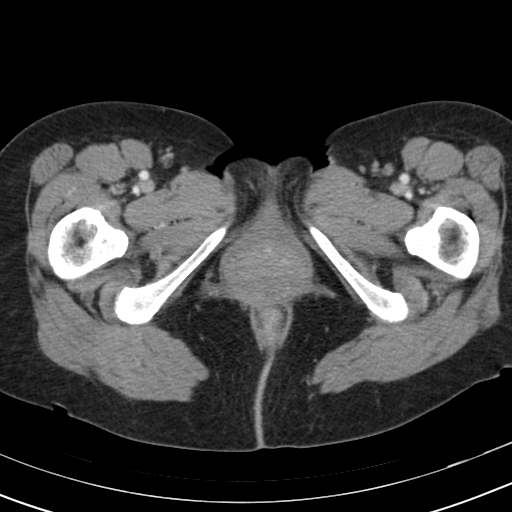
[im 5/94  bone]
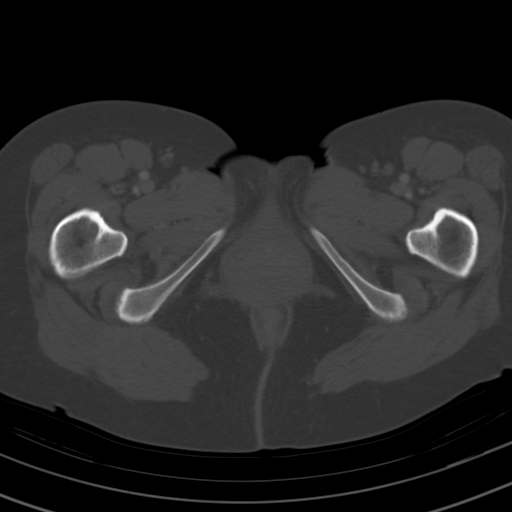
[im 14/94  soft-tissue]
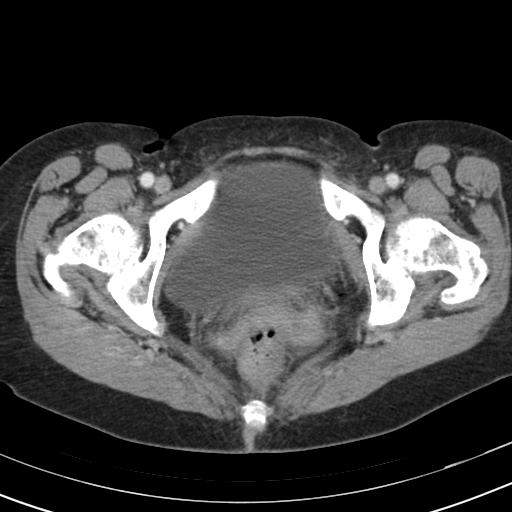
[im 19/94  soft-tissue]
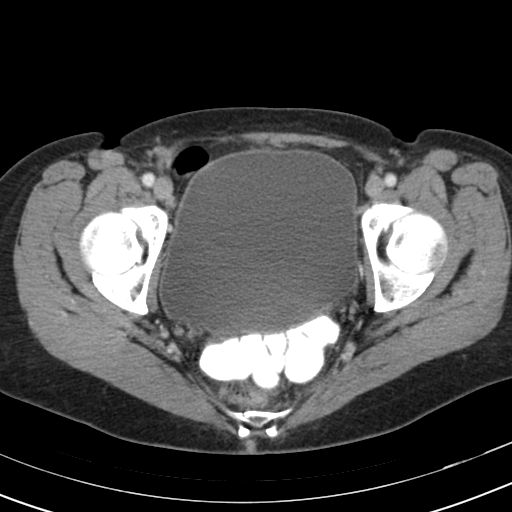
[im 28/94  soft-tissue]
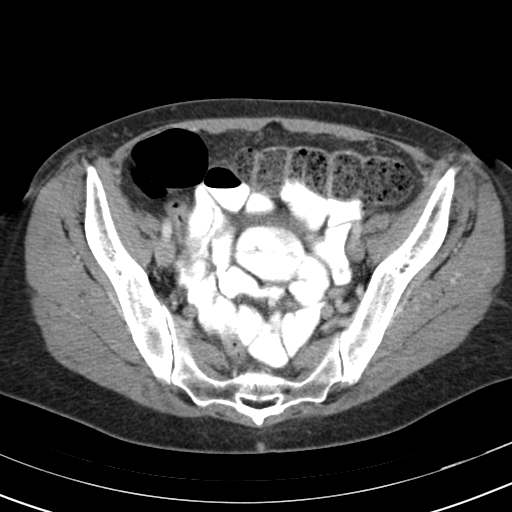
[im 33/94  soft-tissue]
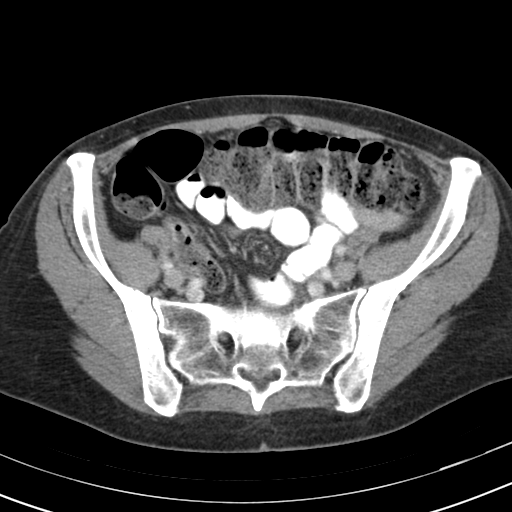
[im 42/94  soft-tissue]
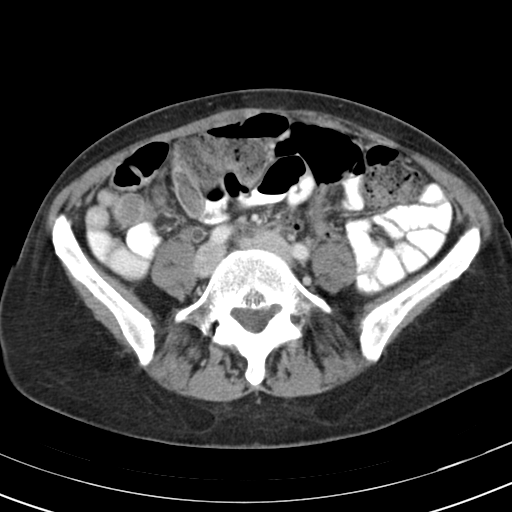
[im 47/94  soft-tissue]
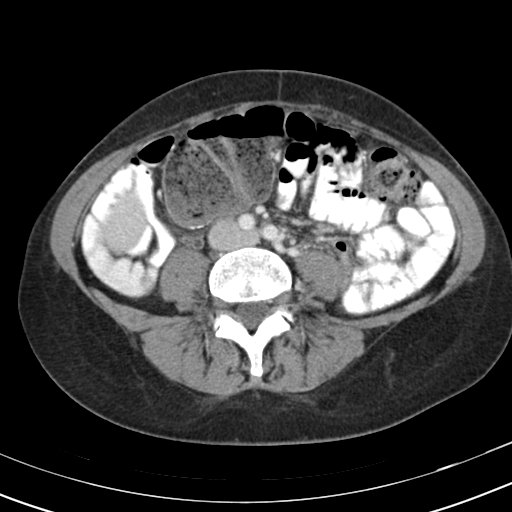
[im 52/94  soft-tissue]
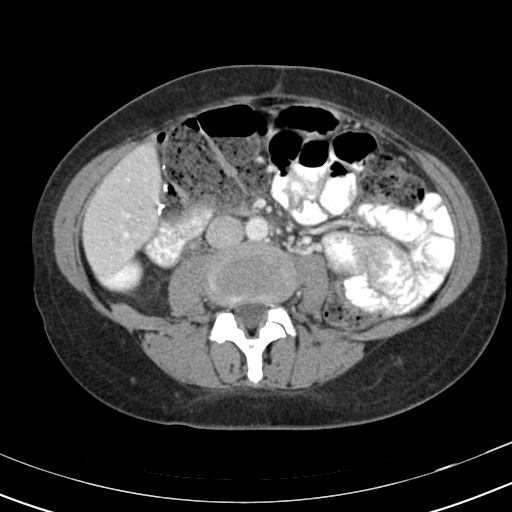
[im 61/94  soft-tissue]
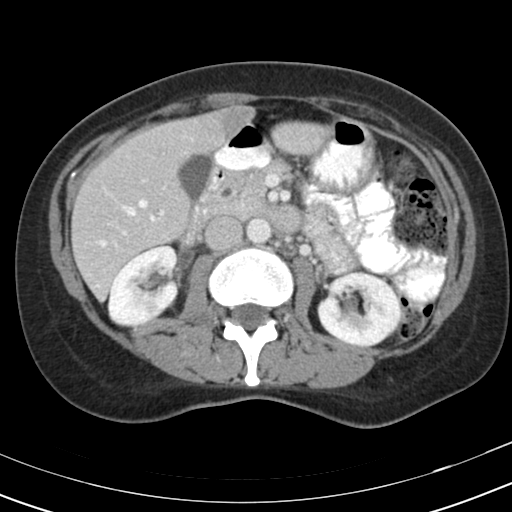
[im 61/94  bone]
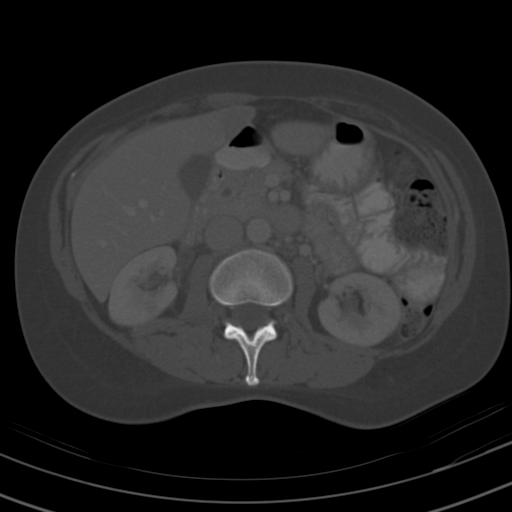
[im 66/94  soft-tissue]
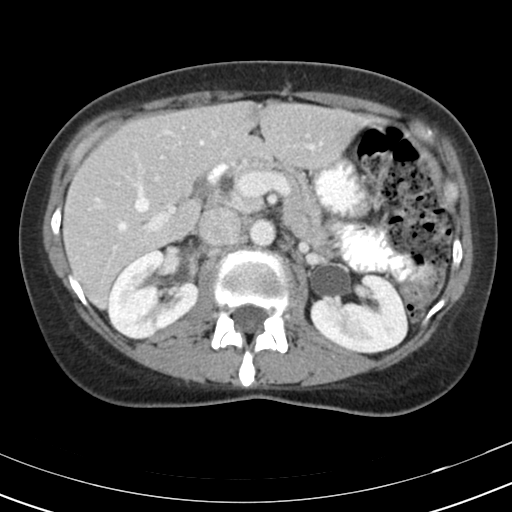
[im 75/94  soft-tissue]
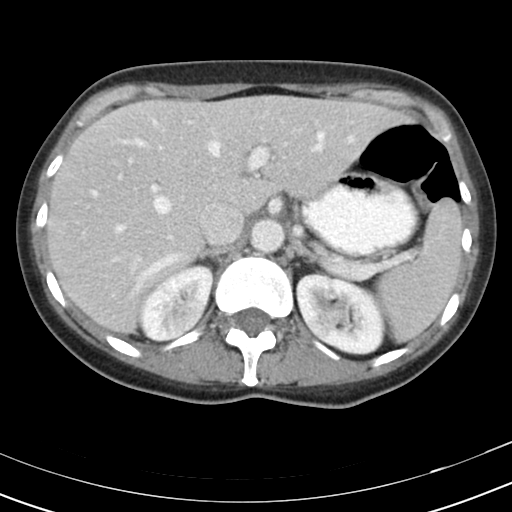
[im 80/94  soft-tissue]
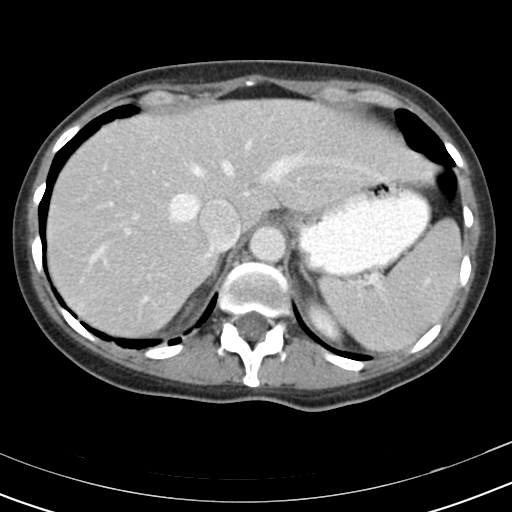
[im 89/94  soft-tissue]
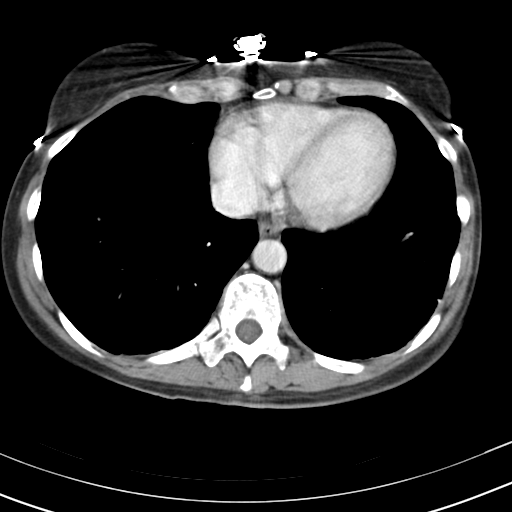

[Series 602: coronal · coronal · 0.91mm/px · 3 of 79 slices shown]
[im 27/79  soft-tissue]
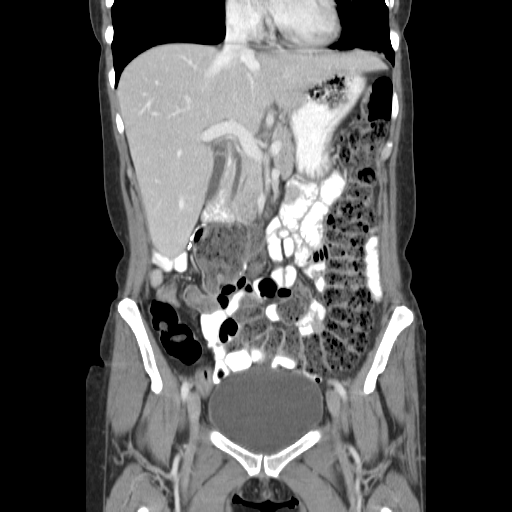
[im 35/79  soft-tissue]
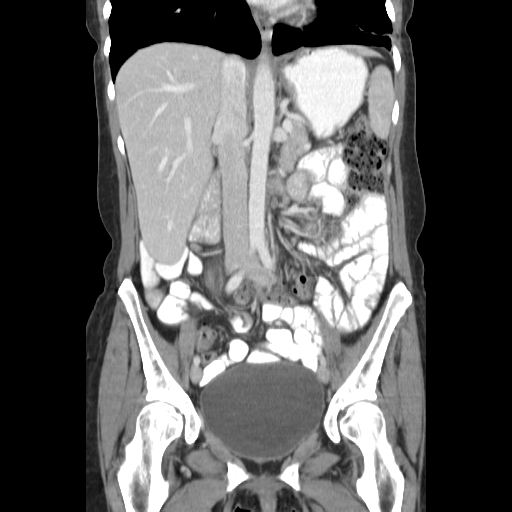
[im 44/79  soft-tissue]
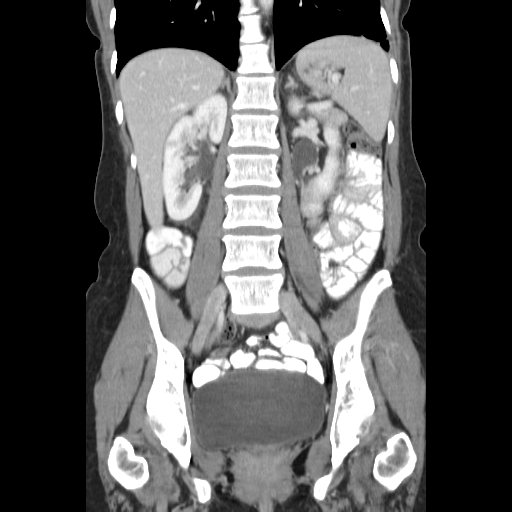

[16 of 46 positions shown; findings below may reference images not displayed]

FINDINGS: Lower chest:  Unremarkable.

Hepatobiliary: No masses or other significant abnormality
identified. The gallbladder is present and normal appearing.

Pancreas: No evidence of mass, inflammatory changes, or other
significant abnormality.

Spleen:  Within normal limits in size and appearance.

Adrenal Glands:  No masses identified.

Kidneys/Urinary Tract: No evidence of urolithiasis or
hydronephrosis. No solid or complex cystic renal masses identified.
There are 2 wedge-shaped areas of hypoattenuation in the lateral and
posterior cortex of the right kidney, involving the entire thickness
of the cortex. There is a less defined hypoattenuation of the
superior pole of the left kidney, which does not extend all the way
to the outer cortex. No masses or calculi seen involving the lower
urinary tract. The urinary bladder is very distended causing
secondary mild hydroureter bilaterally. There are bilateral
extrarenal pelvises.

Stomach/Bowel/Peritoneum: No evidence of wall thickening, mass, or
obstruction. Has been a prior right partial colectomy.

Vascular/Lymphatic: No pathologically enlarged lymph nodes
identified. No other significant abnormality noted.

Reproductive: No masses or other significant abnormality identified.
The uterus is small or surgically absent.

Other:  None.

Musculoskeletal:  No suspicious bone lesions identified.
IMPRESSION: Areas of hypoattenuation within the bilateral renal cortices, which
are most consistent with pyelonephritis. No evidence of abscess
formation.

Bilateral hydroureter, likely secondary to significant distention of
the urinary bladder.

No evidence of cholecystitis.

Partial right colonic resection.

## 2016-08-01 ENCOUNTER — Encounter: Payer: Self-pay | Admitting: Internal Medicine

## 2016-08-01 ENCOUNTER — Other Ambulatory Visit: Payer: Self-pay

## 2016-09-07 ENCOUNTER — Other Ambulatory Visit: Payer: Self-pay | Admitting: Physician Assistant

## 2016-09-07 NOTE — Telephone Encounter (Signed)
Please call Fioricet 

## 2016-09-17 ENCOUNTER — Encounter: Payer: Self-pay | Admitting: Internal Medicine

## 2016-09-19 ENCOUNTER — Ambulatory Visit (INDEPENDENT_AMBULATORY_CARE_PROVIDER_SITE_OTHER): Payer: 59 | Admitting: Internal Medicine

## 2016-09-19 VITALS — BP 112/74 | HR 68 | Temp 97.5°F | Resp 16 | Ht 67.25 in | Wt 140.8 lb

## 2016-09-19 DIAGNOSIS — Z79899 Other long term (current) drug therapy: Secondary | ICD-10-CM

## 2016-09-19 DIAGNOSIS — B351 Tinea unguium: Secondary | ICD-10-CM | POA: Diagnosis not present

## 2016-09-19 LAB — HEPATIC FUNCTION PANEL
ALK PHOS: 40 U/L (ref 33–130)
ALT: 18 U/L (ref 6–29)
AST: 21 U/L (ref 10–35)
Albumin: 4.2 g/dL (ref 3.6–5.1)
BILIRUBIN INDIRECT: 0.3 mg/dL (ref 0.2–1.2)
BILIRUBIN TOTAL: 0.4 mg/dL (ref 0.2–1.2)
Bilirubin, Direct: 0.1 mg/dL (ref <=0.2)
Total Protein: 6.7 g/dL (ref 6.1–8.1)

## 2016-09-19 MED ORDER — TERBINAFINE HCL 250 MG PO TABS: 250.0000 mg | ORAL_TABLET | Freq: Every day | ORAL | 0 refills | Status: DC

## 2016-09-19 NOTE — Patient Instructions (Signed)

## 2016-09-21 ENCOUNTER — Encounter: Payer: Self-pay | Admitting: Internal Medicine

## 2016-09-21 NOTE — Progress Notes (Signed)
  Subjective:    Patient ID: Briana EmmerRhonda Charles, female    DOB: [birth year], 61 y.o.   MRN: [id number]  HPI Patient is a nice 61 yo MWF presenting with concerns re: Lt 1st toenail. No hx/o trauma or pain.   Medication Sig  . ALPRAZolam (XANAX) 0.5 MG tablet Take 0.5 mg by mouth as needed for anxiety.  Marland Kitchen. buPROPion (WELLBUTRIN XL) 300 MG 24 hr tablet Take 300 mg by mouth daily.  . Butalbital-APAP-Caffeine 50-300-40 MG CAPS TAKE 1 CAPSULE BY MOUTH EVERY 6 HOURS AS NEEDED  . Cholecalciferol (VITAMIN D PO) Take 3,000 Units by mouth daily.  Marland Kitchen. MINIVELLE 0.1 MG/24HR patch   . azithromycin (ZITHROMAX) 250 MG tablet Take 2 tablets (500 mg) on  Day 1,  followed by 1 tablet (250 mg) once daily on Days 2 through 5.  . benzonatate (TESSALON PERLES) 100 MG capsule Take 2 capsules (200 mg total) by mouth 3 (three) times daily as needed for cough (Max: 600mg  per day).  . predniSONE (DELTASONE) 20 MG tablet 2 tablets daily for 3 days, 1 tablet daily for 4 days.   Allergies  Allergen Reactions  . Luvox [Fluvoxamine] Nausea And Vomiting  . Sulfa Antibiotics Rash   Past Medical History:  Diagnosis Date  . Chronic tension headaches   . Depression   . IBS (irritable bowel syndrome)   . Labile hypertension   . Vitamin D deficiency    Past Surgical History:  Procedure Laterality Date  . ABDOMINAL HYSTERECTOMY     total hysterectomy  . BILATERAL TEMPOROMANDIBULAR JOINT ARTHROPLASTY  1986, 1988  . HEMICOLECTOMY  1991  . TUBAL LIGATION  1981   Review of Systems  10 point systems review negative except as above.    Objective:   Physical Exam  BP 112/74   Pulse 68   Temp 97.5 F (36.4 C)   Resp 16   Ht 5' 7.25" (1.708 m)   Wt 140 lb 12.8 oz (63.9 kg)   BMI 21.89 kg/m   HEENT - WNL. Neck - supple.  Chest - Clear equal BS. Cor - Nl HS. RRR w/o sig MGR. PP 1(+). No edema. MS- FROM w/o deformities.  Gait Nl. Neuro -  Nl w/o focal abnormalities. Skin - Lt 1st toenail chalky white, dystrophic       Assessment & Plan:   1. Onychomycosis of left great toe   2. Medication management  - Hepatic function panel  - Lamisil 250 mg #  90

## 2016-10-01 ENCOUNTER — Other Ambulatory Visit: Payer: Self-pay | Admitting: Internal Medicine

## 2016-10-01 ENCOUNTER — Encounter: Payer: Self-pay | Admitting: Internal Medicine

## 2016-10-01 MED ORDER — CIPROFLOXACIN HCL 500 MG PO TABS: ORAL_TABLET | ORAL | 0 refills | Status: AC

## 2016-10-01 MED ORDER — NITROFURANTOIN MONOHYD MACRO 100 MG PO CAPS: ORAL_CAPSULE | ORAL | 0 refills | Status: AC

## 2017-01-18 ENCOUNTER — Encounter: Payer: Self-pay | Admitting: Gynecology

## 2017-01-18 ENCOUNTER — Ambulatory Visit
Admission: EM | Admit: 2017-01-18 | Discharge: 2017-01-18 | Disposition: A | Discharge status: Home / Self Care | Payer: 59 | Attending: Family Medicine | Admitting: Family Medicine

## 2017-01-18 DIAGNOSIS — R300 Dysuria: Secondary | ICD-10-CM | POA: Diagnosis not present

## 2017-01-18 LAB — URINALYSIS, COMPLETE (UACMP) WITH MICROSCOPIC
BILIRUBIN URINE: NEGATIVE
GLUCOSE, UA: NEGATIVE mg/dL
HGB URINE DIPSTICK: NEGATIVE
KETONES UR: NEGATIVE mg/dL
LEUKOCYTES UA: NEGATIVE
NITRITE: NEGATIVE
Protein, ur: NEGATIVE mg/dL
RBC / HPF: NONE SEEN RBC/hpf (ref 0–5)
Specific Gravity, Urine: 1.01 (ref 1.005–1.030)
pH: 6 (ref 5.0–8.0)

## 2017-01-18 MED ORDER — NITROFURANTOIN MONOHYD MACRO 100 MG PO CAPS: 100.0000 mg | ORAL_CAPSULE | Freq: Two times a day (BID) | ORAL | 0 refills | Status: DC

## 2017-01-18 NOTE — Discharge Instructions (Signed)
Take medication as prescribed. Rest. Drink plenty of fluids.  ° °Follow up with your primary care physician this week as needed. Return to Urgent care for new or worsening concerns.  ° °

## 2017-01-18 NOTE — ED Triage Notes (Signed)
Per patient c/o lower back, frequency with urination x 1  week.

## 2017-01-18 NOTE — ED Provider Notes (Signed)
[facility] ____________________________________________  Time seen: Approximately 3:19 PM  I have reviewed the triage vital signs and the nursing notes.   HISTORY  Chief Complaint Urinary Tract Infection   HPI Briana Charles is a 61 y.o. female presenting for evaluation of recent dysuria. Patient reports the last week she had been having urinary frequency, urinary urgency, burning with urination and a few episodes of noticing hematuria. Patient reports she just recently had a root canal and was taking oral amoxicillin this week 7 day course, and states while taking this she noticed symptoms improved and nearly resolved. States only had occasional urinary frequency and urgency now. Patient states that she has had a few urinary tract infections in the past with episode of pyelonephritis earlier this year, and patient states concerned and wanted to make sure she does not still have a UTI. Patient reports continues to eat and drink well. Denies known fevers. Denies abdominal pain. Reports she has had some right-sided back pain, stating only present with movement, none to palpation or rest. Patient denies vomiting, diarrhea, known triggers. Reports otherwise feels well. No over-the-counter medications taken for the same complaints.  Denies chest pain, shortness of breath, abdominal pain, abnormal bleeding, dysuria, extremity pain, extremity swelling or rash. Denies recent sickness. Denies recent antibiotic use.    Past Medical History:  Diagnosis Date  . Chronic tension headaches   . Depression   . IBS (irritable bowel syndrome)   . Labile hypertension   . Vitamin D deficiency     Patient Active Problem List   Diagnosis Date Noted  . Body mass index (BMI) of 20.0-20.9 in adult 02/13/2015  . Abnormal glucose 02/02/2014  . Mixed hyperlipidemia 02/02/2014  . Medication management 02/02/2014  . IBS (irritable bowel syndrome)   . Labile hypertension   . Vitamin D deficiency     . Depression, controlled   . Chronic tension headaches     Past Surgical History:  Procedure Laterality Date  . ABDOMINAL HYSTERECTOMY     total hysterectomy  . BILATERAL TEMPOROMANDIBULAR JOINT ARTHROPLASTY  1986, 1988  . HEMICOLECTOMY  1991  . TUBAL LIGATION  1981     No current facility-administered medications for this encounter.   Current Outpatient Prescriptions:  .  ALPRAZolam (XANAX) 0.5 MG tablet, Take 0.5 mg by mouth as needed for anxiety., Disp: , Rfl:  .  buPROPion (WELLBUTRIN XL) 300 MG 24 hr tablet, Take 300 mg by mouth daily., Disp: , Rfl:  .  Butalbital-APAP-Caffeine 50-300-40 MG CAPS, TAKE 1 CAPSULE BY MOUTH EVERY 6 HOURS AS NEEDED, Disp: 60 capsule, Rfl: 0 .  Cholecalciferol (VITAMIN D PO), Take 3,000 Units by mouth daily., Disp: , Rfl:  .  MINIVELLE 0.1 MG/24HR patch, , Disp: , Rfl:  .  terbinafine (LAMISIL) 250 MG tablet, Take 1 tablet (250 mg total) by mouth daily., Disp: 90 tablet, Rfl: 0 .  nitrofurantoin, macrocrystal-monohydrate, (MACROBID) 100 MG capsule, Take 1 capsule (100 mg total) by mouth 2 (two) times daily., Disp: 10 capsule, Rfl: 0  Allergies Luvox [fluvoxamine] and Sulfa antibiotics  Family History  Problem Relation Age of Onset  . Cancer Mother        lung  . Cancer Father        colon  . Alzheimer's disease Father     Social History Social History  Substance Use Topics  . Smoking status: Never Smoker  . Smokeless tobacco: Never Used  . Alcohol use Yes     Comment: Social  Review of Systems Constitutional: No fever/chills Cardiovascular: Denies chest pain. Respiratory: Denies shortness of breath. Gastrointestinal: No abdominal pain.  No nausea, no vomiting.  No diarrhea.  Denies blood in stool.  Genitourinary: Positive for dysuria. Musculoskeletal: Negative for back pain. Skin: Negative for rash.  ____________________________________________   PHYSICAL EXAM:  VITAL SIGNS: ED Triage Vitals  Enc Vitals Group     BP  01/18/17 1338 (!) 133/58     Pulse Rate 01/18/17 1338 84     Resp 01/18/17 1338 16     Temp 01/18/17 1338 98.5 F (36.9 C)     Temp Source 01/18/17 1338 Oral     SpO2 01/18/17 1338 100 %     Weight 01/18/17 1339 137 lb (62.1 kg)     Height 01/18/17 1339  (1.727 m)     Head Circumference --      Peak Flow --      Pain Score --      Pain Loc --      Pain Edu? --      Excl. in GC? --     Constitutional: Alert and oriented. Well appearing and in no acute distress. Cardiovascular: Normal rate, regular rhythm. Grossly normal heart sounds.  Good peripheral circulation. Respiratory: Normal respiratory effort without tachypnea nor retractions. Breath sounds are clear and equal bilaterally. No wheezes, rales, rhonchi. Gastrointestinal: Soft and nontender. No CVA tenderness. Musculoskeletal: No midline cervical, thoracic or lumbar tenderness to palpation.  Neurologic:  Normal speech and language. Speech is normal. No gait instability.  Skin:  Skin is warm, dry. Psychiatric: Mood and affect are normal. Speech and behavior are normal. Patient exhibits appropriate insight and judgment.    ___________________________________________   LABS (all labs ordered are listed, but only abnormal results are displayed)  Labs Reviewed  URINALYSIS, COMPLETE (UACMP) WITH MICROSCOPIC - Abnormal; Notable for the following:       Result Value   Squamous Epithelial / LPF 6-30 (*)    Bacteria, UA FEW (*)    All other components within normal limits  URINE CULTURE   ____________________________________________   PROCEDURES Procedures    INITIAL IMPRESSION / ASSESSMENT AND PLAN / ED COURSE  Pertinent labs & imaging results that were available during my care of the patient were reviewed by me and considered in my medical decision making (see chart for details).  Very well-appearing patient. No acute distress. Suspect patient likely recently had a UTI, sounds like may have been treated with  amoxicillin. Discussed with current urine not clearly a UTI, however few bacteria present, but some contamination, will culture urine and empirically start patient on oral Macrobid. Encourage rest, fluids, supportive care.  Discussed follow up with Primary care physician this week. Discussed follow up and return parameters including no resolution or any worsening concerns. Patient verbalized understanding and agreed to plan.   ____________________________________________   FINAL CLINICAL IMPRESSION(S) / ED DIAGNOSES  Final diagnoses:  Dysuria     Discharge Medication List as of 01/18/2017  2:12 PM    START taking these medications   Details  nitrofurantoin, macrocrystal-monohydrate, (MACROBID) 100 MG capsule Take 1 capsule (100 mg total) by mouth 2 (two) times daily., Starting Sat 01/18/2017, Normal        Note: This dictation was prepared with Dragon dictation along with smaller phrase technology. Any transcriptional errors that result from this process are unintentional.         Renford Dills, NP 01/18/17 1523

## 2017-01-20 LAB — URINE CULTURE

## 2017-01-21 ENCOUNTER — Other Ambulatory Visit: Payer: Self-pay | Admitting: Internal Medicine

## 2017-01-21 ENCOUNTER — Encounter: Payer: Self-pay | Admitting: Internal Medicine

## 2017-01-30 ENCOUNTER — Other Ambulatory Visit: Payer: Self-pay | Admitting: Internal Medicine

## 2017-01-30 MED ORDER — BUTALBITAL-APAP-CAFFEINE 50-300-40 MG PO CAPS: ORAL_CAPSULE | ORAL | 0 refills | Status: AC

## 2017-01-31 ENCOUNTER — Telehealth: Payer: Self-pay | Admitting: *Deleted

## 2017-01-31 NOTE — Telephone Encounter (Signed)
Refill for Fioricet called to [facility] in [place].

## 2017-04-14 ENCOUNTER — Encounter (INDEPENDENT_AMBULATORY_CARE_PROVIDER_SITE_OTHER): Payer: Self-pay

## 2017-04-14 ENCOUNTER — Other Ambulatory Visit: Payer: Self-pay | Admitting: Internal Medicine

## 2017-04-14 MED ORDER — PREDNISONE 20 MG PO TABS: ORAL_TABLET | ORAL | 0 refills | Status: DC

## 2017-04-14 MED ORDER — AZITHROMYCIN 250 MG PO TABS: ORAL_TABLET | ORAL | 1 refills | Status: DC

## 2017-04-18 ENCOUNTER — Encounter: Payer: Self-pay | Admitting: Internal Medicine

## 2017-05-23 ENCOUNTER — Ambulatory Visit (INDEPENDENT_AMBULATORY_CARE_PROVIDER_SITE_OTHER): Payer: BLUE CROSS/BLUE SHIELD | Admitting: Internal Medicine

## 2017-05-23 ENCOUNTER — Encounter: Payer: Self-pay | Admitting: Internal Medicine

## 2017-05-23 VITALS — BP 122/76 | HR 72 | Temp 97.7°F | Resp 18 | Ht 67.5 in | Wt 141.0 lb

## 2017-05-23 DIAGNOSIS — Z79899 Other long term (current) drug therapy: Secondary | ICD-10-CM

## 2017-05-23 DIAGNOSIS — J0100 Acute maxillary sinusitis, unspecified: Secondary | ICD-10-CM

## 2017-05-23 DIAGNOSIS — R5383 Other fatigue: Secondary | ICD-10-CM

## 2017-05-23 DIAGNOSIS — I10 Essential (primary) hypertension: Secondary | ICD-10-CM | POA: Diagnosis not present

## 2017-05-23 DIAGNOSIS — Z0000 Encounter for general adult medical examination without abnormal findings: Secondary | ICD-10-CM

## 2017-05-23 DIAGNOSIS — Z111 Encounter for screening for respiratory tuberculosis: Secondary | ICD-10-CM | POA: Diagnosis not present

## 2017-05-23 DIAGNOSIS — R7309 Other abnormal glucose: Secondary | ICD-10-CM

## 2017-05-23 DIAGNOSIS — G44229 Chronic tension-type headache, not intractable: Secondary | ICD-10-CM

## 2017-05-23 DIAGNOSIS — Z1211 Encounter for screening for malignant neoplasm of colon: Secondary | ICD-10-CM

## 2017-05-23 DIAGNOSIS — Z8249 Family history of ischemic heart disease and other diseases of the circulatory system: Secondary | ICD-10-CM

## 2017-05-23 DIAGNOSIS — Z136 Encounter for screening for cardiovascular disorders: Secondary | ICD-10-CM | POA: Diagnosis not present

## 2017-05-23 DIAGNOSIS — E782 Mixed hyperlipidemia: Secondary | ICD-10-CM

## 2017-05-23 DIAGNOSIS — Z1212 Encounter for screening for malignant neoplasm of rectum: Secondary | ICD-10-CM

## 2017-05-23 DIAGNOSIS — E559 Vitamin D deficiency, unspecified: Secondary | ICD-10-CM

## 2017-05-23 DIAGNOSIS — R0989 Other specified symptoms and signs involving the circulatory and respiratory systems: Secondary | ICD-10-CM

## 2017-05-23 DIAGNOSIS — Z0001 Encounter for general adult medical examination with abnormal findings: Secondary | ICD-10-CM

## 2017-05-23 MED ORDER — AZITHROMYCIN 250 MG PO TABS: ORAL_TABLET | ORAL | 1 refills | Status: DC

## 2017-05-23 MED ORDER — PREDNISONE 20 MG PO TABS: ORAL_TABLET | ORAL | 0 refills | Status: DC

## 2017-05-23 NOTE — Patient Instructions (Signed)

## 2017-05-23 NOTE — Progress Notes (Signed)
[facility] Lucky Cowboy, M.D.     [name]. Steffanie Dunn, P.A.-C Judd Gaudier, DNP [facility] [address] [place], [place]. [address] Telephone [phone] Telefax [phone] Annual Screening/Preventative Visit & Comprehensive Evaluation &  Examination     This very nice 62 y.o. MWF presents for a Screening/Preventative Visit & comprehensive evaluation and management of multiple medical co-morbidities.  Patient has been followed for HTN, T2_NIDDM  Prediabetes, Hyperlipidemia and Vitamin D Deficiency.     Patient has been followed expectantly with Labile HTN predates since 2000. Patient's BP has been controlled at home and patient denies any cardiac symptoms as chest pain, palpitations, shortness of breath, dizziness or ankle swelling. Today's BP: 122/76      Patient's hyperlipidemia is near controlled with diet. Last lipids were almost to goal: Lab Results  Component Value Date   CHOL 206 (H) 03/19/2016   HDL 87 03/19/2016   LDLCALC 101 (H) 03/19/2016   TRIG 92 03/19/2016   CHOLHDL 2.4 03/19/2016      Patient ois also expectantly monitored for prediabetes  and patient denies reactive hypoglycemic symptoms, visual blurring, diabetic polys, or paresthesias. Last A1c was Normal & at goal: Lab Results  Component Value Date   HGBA1C 5.1 03/19/2016      Finally, patient has history of Vitamin D Deficiency and last Vitamin D was sl elevated & dose was tapered.  Lab Results  Component Value Date   VD25OH 113 (H) 03/19/2016   Current Outpatient Medications on File Prior to Visit  Medication Sig  . ALPRAZolam (XANAX) 0.5 MG tablet Take 0.5 mg by mouth as needed for anxiety.  Marland Kitchen buPROPion (WELLBUTRIN XL) 300 MG 24 hr tablet Take 300 mg by mouth daily.  . Cholecalciferol (VITAMIN D PO) Take 3,000 Units by mouth daily.  Marland Kitchen MINIVELLE 0.1 MG/24HR patch    No current facility-administered medications on file prior to  visit.    Allergies  Allergen Reactions  . Luvox [Fluvoxamine] Nausea And Vomiting  . Sulfa Antibiotics Rash   Past Medical History:  Diagnosis Date  . Chronic tension headaches   . Depression   . IBS (irritable bowel syndrome)   . Labile hypertension   . Vitamin D deficiency    Health Maintenance  Topic Date Due  . MAMMOGRAM  08/11/2009  . INFLUENZA VACCINE  11/14/2018 (Originally 11/13/2016)  . PAP SMEAR  08/21/2017  . TETANUS/TDAP  01/11/2020  . COLONOSCOPY  08/02/2026  . Hepatitis C Screening  Completed  . HIV Screening  Completed   Immunization History  Administered Date(s) Administered  . Influenza Split 02/02/2014, 02/13/2015  . Influenza,inj,quad, With Preservative 03/19/2016  . PPD Test 02/02/2014, 02/13/2015, 03/19/2016  . Pneumococcal-Unspecified 01/27/2012  . Tdap 01/10/2010   Last Colon - 07/25/2016 - Dr Rosana Hoes - recc 10 yr f/u - due 07/2026 Last MGM - 08/30/2016 - Dr Donnetta Hail office Past Surgical History:  Procedure Laterality Date  . ABDOMINAL HYSTERECTOMY     total hysterectomy  . BILATERAL TEMPOROMANDIBULAR JOINT ARTHROPLASTY  1986, 1988  . HEMICOLECTOMY  1991  . TUBAL LIGATION  1981   Family History  Problem Relation Age of Onset  . Cancer Mother        lung  . Cancer Father        colon  . Alzheimer's disease Father    Social History   Tobacco Use  . Smoking status: Never Smoker  . Smokeless tobacco: Never Used  Substance Use Topics  .  Alcohol use: Yes    Comment: Social  . Drug use: No    ROS Constitutional: Denies fever, chills, weight loss/gain, headaches, insomnia,  night sweats, and change in appetite. Does c/o fatigue. Eyes: Denies redness, blurred vision, diplopia, discharge, itchy, watery eyes.  ENT: Denies discharge, congestion, post nasal drip, epistaxis, sore throat, earache, hearing loss, dental pain, Tinnitus, Vertigo, Sinus pain, snoring.  Cardio: Denies chest pain, palpitations, irregular heartbeat, syncope, dyspnea,  diaphoresis, orthopnea, PND, claudication, edema Respiratory: denies cough, dyspnea, DOE, pleurisy, hoarseness, laryngitis, wheezing.  Gastrointestinal: Denies dysphagia, heartburn, reflux, water brash, pain, cramps, nausea, vomiting, bloating, diarrhea, constipation, hematemesis, melena, hematochezia, jaundice, hemorrhoids Genitourinary: Denies dysuria, frequency, urgency, nocturia, hesitancy, discharge, hematuria, flank pain Breast: Breast lumps, nipple discharge, bleeding.  Musculoskeletal: Denies arthralgia, myalgia, stiffness, Jt. Swelling, pain, limp, and strain/sprain. Denies falls. Skin: Denies puritis, rash, hives, warts, acne, eczema, changing in skin lesion Neuro: No weakness, tremor, incoordination, spasms, paresthesia, pain Psychiatric: Denies confusion, memory loss, sensory loss. Denies Depression. Endocrine: Denies change in weight, skin, hair change, nocturia, and paresthesia, diabetic polys, visual blurring, hyper / hypo glycemic episodes.  Heme/Lymph: No excessive bleeding, bruising, enlarged lymph nodes.  Physical Exam  BP 122/76   Pulse 72   Temp 97.7 F (36.5 C)   Resp 18   Ht 5' 7.5" (1.715 m)   Wt 141 lb (64 kg)   BMI 21.76 kg/m   General Appearance: Well nourished, well groomed and in no apparent distress.  Eyes: PERRLA, EOMs, conjunctiva no swelling or erythema, normal fundi and vessels. Sinuses: No frontal/maxillary tenderness ENT/Mouth: EACs patent / TMs  nl. Nares clear without erythema, swelling, mucoid exudates. Oral hygiene is good. No erythema, swelling, or exudate. Tongue normal, non-obstructing. Tonsils not swollen or erythematous. Hearing normal.  Neck: Supple, thyroid not palpable. No bruits, nodes or JVD. Respiratory: Respiratory effort normal.  BS equal and clear bilateral without rales, rhonci, wheezing or stridor. Cardio: Heart sounds are normal with regular rate and rhythm and no murmurs, rubs or gallops. Peripheral pulses are normal and equal  bilaterally without edema. No aortic or femoral bruits. Chest: symmetric with normal excursions and percussion. Breasts: Symmetric, without lumps, nipple discharge, retractions, or fibrocystic changes.  Abdomen: Flat, soft with bowel sounds active. Nontender, no guarding, rebound, hernias, masses, or organomegaly.  Lymphatics: Non tender without lymphadenopathy.  Genitourinary: Def to Gyn - Dr Jennette Kettle. Musculoskeletal: Full ROM all peripheral extremities, joint stability, 5/5 strength, and normal gait. Skin: Warm and dry without rashes, lesions, cyanosis, clubbing or  ecchymosis.  Neuro: Cranial nerves intact, reflexes equal bilaterally. Normal muscle tone, no cerebellar symptoms. Sensation intact.  Pysch: Alert and oriented X 3, normal affect, Insight and Judgment appropriate.   Assessment and Plan  1. Annual Preventative Screening Examination  2. Labile hypertension  - EKG 12-Lead - [place], RETROPERITNL ABD,  LTD - Urinalysis, Routine w reflex microscopic - Microalbumin / creatinine urine ratio - CBC with Differential/Platelet - BASIC METABOLIC PANEL WITH GFR - Magnesium - TSH  3. Hyperlipidemia, mixed  - EKG 12-Lead - [place], RETROPERITNL ABD,  LTD - Hepatic function panel - Lipid panel - TSH  4. Abnormal glucose  - Hemoglobin A1c - Insulin, random  5. Vitamin D deficiency  - VITAMIN D 25 Hydroxy   6. Chronic tension-type headache   7. Screening for colorectal cancer  - POC Hemoccult Bld/Stl   8. Screening for ischemic heart disease  - EKG 12-Lead  9. FH: cardiovascular disease  - EKG 12-Lead - [place], RETROPERITNL  ABD,  LTD  10. Screening for AAA (aortic abdominal aneurysm)  - US, RETROPERITNL ABD,  LTD  11. Screening examination for pulmonary tuberculosis  - PPD  12. Subacute maxillary sinusitis   13. Fatigue, unspecified type  - Iron,Total/Total Iron Binding Cap - Vitamin B12 - CBC with Differential/Platelet - TSH  14. Medication management  -  Urinalysis, Routine w reflex microscopic - Microalbumin / creatinine urine ratio - CBC with Differential/Platelet - BASIC METABOLIC PANEL WITH GFR - Hepatic function panel - Magnesium - Lipid panel - TSH - Hemoglobin A1c - Insulin, random - VITAMIN D 25 Hydroxy         Patient was counseled in prudent diet to achieve/maintain BMI less than 25 for weight control, BP monitoring, regular exercise and medications. Discussed med's effects and SE's. Screening labs and tests as requested with regular follow-up as recommended. Over 40 minutes of exam, counseling, chart review and high complex critical decision making was performed.

## 2017-05-25 ENCOUNTER — Encounter: Payer: Self-pay | Admitting: Internal Medicine

## 2017-05-26 ENCOUNTER — Encounter (INDEPENDENT_AMBULATORY_CARE_PROVIDER_SITE_OTHER): Payer: Self-pay

## 2017-05-26 LAB — CBC WITH DIFFERENTIAL/PLATELET
BASOS ABS: 67 {cells}/uL (ref 0–200)
BASOS PCT: 0.9 %
EOS ABS: 370 {cells}/uL (ref 15–500)
Eosinophils Relative: 5 %
HCT: 38.8 % (ref 35.0–45.0)
Hemoglobin: 13.6 g/dL (ref 11.7–15.5)
Lymphs Abs: 762 cells/uL — ABNORMAL LOW (ref 850–3900)
MCH: 30 pg (ref 27.0–33.0)
MCHC: 35.1 g/dL (ref 32.0–36.0)
MCV: 85.7 fL (ref 80.0–100.0)
MONOS PCT: 8.8 %
MPV: 10.6 fL (ref 7.5–12.5)
NEUTROS ABS: 5550 {cells}/uL (ref 1500–7800)
Neutrophils Relative %: 75 %
Platelets: 245 10*3/uL (ref 140–400)
RBC: 4.53 10*6/uL (ref 3.80–5.10)
RDW: 12.1 % (ref 11.0–15.0)
Total Lymphocyte: 10.3 %
WBC: 7.4 10*3/uL (ref 3.8–10.8)
WBCMIX: 651 {cells}/uL (ref 200–950)

## 2017-05-26 LAB — HEPATIC FUNCTION PANEL
AG RATIO: 1.9 (calc) (ref 1.0–2.5)
ALKALINE PHOSPHATASE (APISO): 51 U/L (ref 33–130)
ALT: 17 U/L (ref 6–29)
AST: 21 U/L (ref 10–35)
Albumin: 4.5 g/dL (ref 3.6–5.1)
BILIRUBIN DIRECT: 0.1 mg/dL (ref 0.0–0.2)
BILIRUBIN INDIRECT: 0.4 mg/dL (ref 0.2–1.2)
BILIRUBIN TOTAL: 0.5 mg/dL (ref 0.2–1.2)
GLOBULIN: 2.4 g/dL (ref 1.9–3.7)
Total Protein: 6.9 g/dL (ref 6.1–8.1)

## 2017-05-26 LAB — INSULIN, RANDOM: Insulin: 1.8 u[IU]/mL — ABNORMAL LOW (ref 2.0–19.6)

## 2017-05-26 LAB — URINALYSIS, ROUTINE W REFLEX MICROSCOPIC
BILIRUBIN URINE: NEGATIVE
Glucose, UA: NEGATIVE
HGB URINE DIPSTICK: NEGATIVE
KETONES UR: NEGATIVE
Leukocytes, UA: NEGATIVE
Nitrite: NEGATIVE
PH: 7.5 (ref 5.0–8.0)
Protein, ur: NEGATIVE
SPECIFIC GRAVITY, URINE: 1.006 (ref 1.001–1.03)

## 2017-05-26 LAB — LIPID PANEL
CHOL/HDL RATIO: 2 (calc) (ref <5.0)
CHOLESTEROL: 195 mg/dL (ref <200)
HDL: 97 mg/dL (ref >50)
LDL CHOLESTEROL (CALC): 84 mg/dL
NON-HDL CHOLESTEROL (CALC): 98 mg/dL (ref <130)
Triglycerides: 49 mg/dL (ref <150)

## 2017-05-26 LAB — BASIC METABOLIC PANEL WITH GFR
BUN: 13 mg/dL (ref 7–25)
CO2: 32 mmol/L (ref 20–32)
CREATININE: 0.76 mg/dL (ref 0.50–0.99)
Calcium: 9.7 mg/dL (ref 8.6–10.4)
Chloride: 103 mmol/L (ref 98–110)
GFR, EST AFRICAN AMERICAN: 97 mL/min/{1.73_m2} (ref >=60)
GFR, EST NON AFRICAN AMERICAN: 84 mL/min/{1.73_m2} (ref >=60)
Glucose, Bld: 102 mg/dL — ABNORMAL HIGH (ref 65–99)
Potassium: 4.7 mmol/L (ref 3.5–5.3)
SODIUM: 142 mmol/L (ref 135–146)

## 2017-05-26 LAB — MICROALBUMIN / CREATININE URINE RATIO
Creatinine, Urine: 16 mg/dL — ABNORMAL LOW (ref 20–275)
Microalb Creat Ratio: 31 mcg/mg creat — ABNORMAL HIGH (ref <30)
Microalb, Ur: 0.5 mg/dL

## 2017-05-26 LAB — HEMOGLOBIN A1C
EAG (MMOL/L): 5.8 (calc)
Hgb A1c MFr Bld: 5.3 % of total Hgb (ref <5.7)
MEAN PLASMA GLUCOSE: 105 (calc)

## 2017-05-26 LAB — IRON, TOTAL/TOTAL IRON BINDING CAP
%SAT: 18 % (calc) (ref 11–50)
IRON: 52 ug/dL (ref 45–160)
TIBC: 295 ug/dL (ref 250–450)

## 2017-05-26 LAB — TB SKIN TEST
INDURATION: 0 mm
TB Skin Test: NEGATIVE

## 2017-05-26 LAB — MAGNESIUM: Magnesium: 2.3 mg/dL (ref 1.5–2.5)

## 2017-05-26 LAB — VITAMIN D 25 HYDROXY (VIT D DEFICIENCY, FRACTURES): Vit D, 25-Hydroxy: 97 ng/mL (ref 30–100)

## 2017-05-26 LAB — VITAMIN B12: Vitamin B-12: 720 pg/mL (ref 200–1100)

## 2017-05-26 LAB — TSH: TSH: 1.21 m[IU]/L (ref 0.40–4.50)

## 2017-07-11 ENCOUNTER — Other Ambulatory Visit: Payer: Self-pay | Admitting: Internal Medicine

## 2017-07-13 ENCOUNTER — Other Ambulatory Visit: Payer: Self-pay | Admitting: Internal Medicine

## 2017-09-03 DIAGNOSIS — Z1231 Encounter for screening mammogram for malignant neoplasm of breast: Secondary | ICD-10-CM | POA: Diagnosis not present

## 2017-09-03 DIAGNOSIS — Z13820 Encounter for screening for osteoporosis: Secondary | ICD-10-CM | POA: Diagnosis not present

## 2017-09-03 DIAGNOSIS — Z6822 Body mass index (BMI) 22.0-22.9, adult: Secondary | ICD-10-CM | POA: Diagnosis not present

## 2017-09-03 DIAGNOSIS — Z01419 Encounter for gynecological examination (general) (routine) without abnormal findings: Secondary | ICD-10-CM | POA: Diagnosis not present

## 2017-09-05 ENCOUNTER — Other Ambulatory Visit: Payer: Self-pay | Admitting: Internal Medicine

## 2017-11-04 ENCOUNTER — Encounter: Payer: Self-pay | Admitting: Internal Medicine

## 2018-01-23 ENCOUNTER — Other Ambulatory Visit: Payer: Self-pay | Admitting: *Deleted

## 2018-01-23 MED ORDER — AZITHROMYCIN 250 MG PO TABS: ORAL_TABLET | ORAL | 1 refills | Status: DC

## 2018-03-09 ENCOUNTER — Other Ambulatory Visit: Payer: Self-pay | Admitting: Adult Health

## 2018-05-01 ENCOUNTER — Other Ambulatory Visit: Payer: Self-pay | Admitting: Internal Medicine

## 2018-05-01 MED ORDER — BUTALBITAL-APAP-CAFFEINE 50-300-40 MG PO CAPS: ORAL_CAPSULE | ORAL | 0 refills | Status: DC

## 2018-05-01 MED ORDER — BUTALBITAL-APAP-CAFFEINE 50-325-40 MG PO TABS: ORAL_TABLET | ORAL | 0 refills | Status: DC

## 2018-05-08 DIAGNOSIS — L570 Actinic keratosis: Secondary | ICD-10-CM | POA: Diagnosis not present

## 2018-06-23 ENCOUNTER — Encounter: Payer: Self-pay | Admitting: Internal Medicine

## 2018-06-23 NOTE — Patient Instructions (Signed)

## 2018-06-23 NOTE — Progress Notes (Signed)
[facility] Lucky Cowboy, M.D.     [name]. Steffanie Dunn, P.A.-C Judd Gaudier, DNP [facility] [address] [place], [place]. [address] Telephone [phone] Telefax [phone] Annual Screening/Preventative Visit & Comprehensive Evaluation &  Examination     This very nice 63 y.o. MWF presents for a Screening /Preventative Visit & comprehensive evaluation and management of multiple medical co-morbidities.  Patient has been followed for HTN, HLD, T2_NIDDM  Prediabetes  and Vitamin D Deficiency.     Patient is monitored expectantly for labile  HTN circa 2000. Patient's BP has been controlled at home and patient denies any cardiac symptoms as chest pain, palpitations, shortness of breath, dizziness or ankle swelling. Today's BP is at goal - 112/68.      Patient's hyperlipidemia is controlled with diet. Last lipids were at goal: Lab Results  Component Value Date   CHOL 195 05/23/2017   HDL 97 05/23/2017   LDLCALC 84 05/23/2017   TRIG 49 05/23/2017   CHOLHDL 2.0 05/23/2017      Patient has been proactively monitored for glucose intolerance and patient denies reactive hypoglycemic symptoms, visual blurring, diabetic polys or paresthesias. Last A1c was Normal & at goal: Lab Results  Component Value Date   HGBA1C 5.3 05/23/2017      Finally, patient has history of Vitamin D Deficiency and last Vitamin D was at goal: Lab Results  Component Value Date   VD25OH 97 05/23/2017   Current Outpatient Medications on File Prior to Visit  Medication Sig  . ALPRAZolam (XANAX) 0.5 MG tablet Take 0.5 mg by mouth as needed for anxiety.  Marland Kitchen buPROPion (WELLBUTRIN XL) 300 MG 24 hr tablet Take 300 mg by mouth daily.  . butalbital-acetaminophen-caffeine (FIORICET, ESGIC) 50-325-40 MG tablet Take 1 capsule 3-4 x /day ONLY if needed for painful Headache  . Cholecalciferol (VITAMIN D PO) Take 3,000 Units by mouth daily.  Marland Kitchen MINIVELLE 0.1  MG/24HR patch    No current facility-administered medications on file prior to visit.    Allergies  Allergen Reactions  . Luvox [Fluvoxamine] Nausea And Vomiting  . Sulfa Antibiotics Rash   Past Medical History:  Diagnosis Date  . Chronic tension headaches   . Depression   . IBS (irritable bowel syndrome)   . Labile hypertension   . Vitamin D deficiency    Health Maintenance  Topic Date Due  . MAMMOGRAM  08/11/2009  . PAP SMEAR-Modifier  08/21/2017  . INFLUENZA VACCINE  11/14/2018 (Originally 11/13/2017)  . TETANUS/TDAP  01/11/2020  . COLONOSCOPY  08/02/2026  . Hepatitis C Screening  Completed  . HIV Screening  Completed   Immunization History  Administered Date(s) Administered  . Influenza Split 02/02/2014, 02/13/2015  . Influenza,inj,quad, With Preservative 03/19/2016  . PPD Test 02/02/2014, 02/13/2015, 03/19/2016, 05/23/2017  . Pneumococcal-Unspecified 01/27/2012  . Tdap 01/10/2010   Last Colon - 04.12.2018 - Dr Steva Colder - recommended 10 yr f/u in Apr 2028  Last MGM - 08/2017 - Dr Donnetta Hail office  Past Surgical History:  Procedure Laterality Date  . ABDOMINAL HYSTERECTOMY     total hysterectomy  . BILATERAL TEMPOROMANDIBULAR JOINT ARTHROPLASTY  1986, 1988  . HEMICOLECTOMY  1991  . TUBAL LIGATION  1981   Family History  Problem Relation Age of Onset  . Cancer Mother        lung  . Cancer Father        colon  . Alzheimer's disease Father    Social History   Tobacco  Use  . Smoking status: Never Smoker  . Smokeless tobacco: Never Used  Substance Use Topics  . Alcohol use: Yes    Comment: Social  . Drug use: No    ROS Constitutional: Denies fever, chills, weight loss/gain, headaches, insomnia,  night sweats, and change in appetite. Does c/o fatigue. Eyes: Denies redness, blurred vision, diplopia, discharge, itchy, watery eyes.  ENT: Denies discharge, congestion, post nasal drip, epistaxis, sore throat, earache, hearing loss, dental pain, Tinnitus, Vertigo,  Sinus pain, snoring.  Cardio: Denies chest pain, palpitations, irregular heartbeat, syncope, dyspnea, diaphoresis, orthopnea, PND, claudication, edema Respiratory: denies cough, dyspnea, DOE, pleurisy, hoarseness, laryngitis, wheezing.  Gastrointestinal: Denies dysphagia, heartburn, reflux, water brash, pain, cramps, nausea, vomiting, bloating, diarrhea, constipation, hematemesis, melena, hematochezia, jaundice, hemorrhoids Genitourinary: Denies dysuria, frequency, urgency, nocturia, hesitancy, discharge, hematuria, flank pain Breast: Breast lumps, nipple discharge, bleeding.  Musculoskeletal: Denies arthralgia, myalgia, stiffness, Jt. Swelling, pain, limp, and strain/sprain. Denies falls. Skin: Denies puritis, rash, hives, warts, acne, eczema, changing in skin lesion Neuro: No weakness, tremor, incoordination, spasms, paresthesia, pain Psychiatric: Denies confusion, memory loss, sensory loss. Denies Depression. Endocrine: Denies change in weight, skin, hair change, nocturia, and paresthesia, diabetic polys, visual blurring, hyper / hypo glycemic episodes.  Heme/Lymph: No excessive bleeding, bruising, enlarged lymph nodes.  Physical Exam  BP 112/68   Pulse 68   Temp 97.7 F (36.5 C)   Resp 16   Ht 5' 7.5" (1.715 m)   Wt 137 lb 3.2 oz (62.2 kg)   BMI 21.17 kg/m   General Appearance: Well nourished, well groomed and in no apparent distress.  Eyes: PERRLA, EOMs, conjunctiva no swelling or erythema, normal fundi and vessels. Sinuses: No frontal/maxillary tenderness ENT/Mouth: EACs patent / TMs  nl. Nares clear without erythema, swelling, mucoid exudates. Oral hygiene is good. No erythema, swelling, or exudate. Tongue normal, non-obstructing. Tonsils not swollen or erythematous. Hearing normal.  Neck: Supple, thyroid not palpable. No bruits, nodes or JVD. Respiratory: Respiratory effort normal.  BS equal and clear bilateral without rales, rhonci, wheezing or stridor. Cardio: Heart sounds  are normal with regular rate and rhythm and no murmurs, rubs or gallops. Peripheral pulses are normal and equal bilaterally without edema. No aortic or femoral bruits. Chest: symmetric with normal excursions and percussion. Breasts: Symmetric, without lumps, nipple discharge, retractions, or fibrocystic changes.  Abdomen: Flat, soft with bowel sounds active. Nontender, no guarding, rebound, hernias, masses, or organomegaly.  Lymphatics: Non tender without lymphadenopathy.  Musculoskeletal: Full ROM all peripheral extremities, joint stability, 5/5 strength, and normal gait. Skin: Warm and dry without rashes, lesions, cyanosis, clubbing or  ecchymosis.  Neuro: Cranial nerves intact, reflexes equal bilaterally. Normal muscle tone, no cerebellar symptoms. Sensation intact.  Pysch: Alert and oriented X 3, normal affect, Insight and Judgment appropriate.   Assessment and Plan  1. Annual Preventative Screening Examination  2. Labile hypertension  - EKG 12-Lead - [place], RETROPERITNL ABD,  LTD - Urinalysis, Routine w reflex microscopic - Microalbumin / creatinine urine ratio - CBC with Differential/Platelet - COMPLETE METABOLIC PANEL WITH GFR - Magnesium - TSH  3. Hyperlipidemia, mixed  - EKG 12-Lead - [place], RETROPERITNL ABD,  LTD - Lipid panel - TSH  4. Abnormal glucose  - EKG 12-Lead - [place], RETROPERITNL ABD,  LTD - Hemoglobin A1c - Insulin, random  5. Vitamin D deficiency  - VITAMIN D 25 Hydroxyl  6. Chronic tension-type headache   7. Screening for colorectal cancer  - POC Hemoccult Bld/Stl  8. Screening for ischemic heart  disease  - EKG 12-Lead  9. FH: cardiovascular disease  - EKG 12-Lead - [place], RETROPERITNL ABD,  LTD  10. Screening for AAA (aortic abdominal aneurysm)  - [place], RETROPERITNL ABD,  LTD  11. Screening examination for pulmonary tuberculosis  - TB Skin Test  12. Fatigue  - Iron,Total/Total Iron Binding Cap - Vitamin B12 - CBC with  Differential/Platelet - TSH  13. Medication management  - Urinalysis, Routine w reflex microscopic - Microalbumin / creatinine urine ratio - CBC with Differential/Platelet - COMPLETE METABOLIC PANEL WITH GFR - Magnesium - Lipid panel - TSH - Hemoglobin A1c - Insulin, random - VITAMIN D 25 Hydroxyl        Patient was counseled in prudent diet to achieve/maintain BMI less than 25 for weight control, BP monitoring, regular exercise and medications. Discussed med's effects and SE's. Screening labs and tests as requested with regular follow-up as recommended. Over 40 minutes of exam, counseling, chart review and high complex critical decision making was performed.

## 2018-06-24 ENCOUNTER — Other Ambulatory Visit: Payer: Self-pay | Admitting: *Deleted

## 2018-06-24 ENCOUNTER — Ambulatory Visit (INDEPENDENT_AMBULATORY_CARE_PROVIDER_SITE_OTHER): Payer: BLUE CROSS/BLUE SHIELD | Admitting: Internal Medicine

## 2018-06-24 ENCOUNTER — Other Ambulatory Visit: Payer: Self-pay

## 2018-06-24 VITALS — BP 112/68 | HR 68 | Temp 97.7°F | Resp 16 | Ht 67.5 in | Wt 137.2 lb

## 2018-06-24 DIAGNOSIS — E782 Mixed hyperlipidemia: Secondary | ICD-10-CM

## 2018-06-24 DIAGNOSIS — Z1389 Encounter for screening for other disorder: Secondary | ICD-10-CM | POA: Diagnosis not present

## 2018-06-24 DIAGNOSIS — Z111 Encounter for screening for respiratory tuberculosis: Secondary | ICD-10-CM | POA: Diagnosis not present

## 2018-06-24 DIAGNOSIS — Z1329 Encounter for screening for other suspected endocrine disorder: Secondary | ICD-10-CM

## 2018-06-24 DIAGNOSIS — Z0000 Encounter for general adult medical examination without abnormal findings: Secondary | ICD-10-CM | POA: Diagnosis not present

## 2018-06-24 DIAGNOSIS — Z79899 Other long term (current) drug therapy: Secondary | ICD-10-CM | POA: Diagnosis not present

## 2018-06-24 DIAGNOSIS — Z1211 Encounter for screening for malignant neoplasm of colon: Secondary | ICD-10-CM

## 2018-06-24 DIAGNOSIS — Z0001 Encounter for general adult medical examination with abnormal findings: Secondary | ICD-10-CM

## 2018-06-24 DIAGNOSIS — Z130 Encounter for screening for diseases of the blood and blood-forming organs and certain disorders involving the immune mechanism: Secondary | ICD-10-CM

## 2018-06-24 DIAGNOSIS — I10 Essential (primary) hypertension: Secondary | ICD-10-CM | POA: Diagnosis not present

## 2018-06-24 DIAGNOSIS — R5383 Other fatigue: Secondary | ICD-10-CM

## 2018-06-24 DIAGNOSIS — B351 Tinea unguium: Secondary | ICD-10-CM

## 2018-06-24 DIAGNOSIS — G44229 Chronic tension-type headache, not intractable: Secondary | ICD-10-CM

## 2018-06-24 DIAGNOSIS — R0989 Other specified symptoms and signs involving the circulatory and respiratory systems: Secondary | ICD-10-CM

## 2018-06-24 DIAGNOSIS — Z13220 Encounter for screening for lipoid disorders: Secondary | ICD-10-CM | POA: Diagnosis not present

## 2018-06-24 DIAGNOSIS — M19041 Primary osteoarthritis, right hand: Secondary | ICD-10-CM

## 2018-06-24 DIAGNOSIS — Z8249 Family history of ischemic heart disease and other diseases of the circulatory system: Secondary | ICD-10-CM

## 2018-06-24 DIAGNOSIS — Z1212 Encounter for screening for malignant neoplasm of rectum: Secondary | ICD-10-CM

## 2018-06-24 DIAGNOSIS — E559 Vitamin D deficiency, unspecified: Secondary | ICD-10-CM

## 2018-06-24 DIAGNOSIS — Z131 Encounter for screening for diabetes mellitus: Secondary | ICD-10-CM

## 2018-06-24 DIAGNOSIS — R7309 Other abnormal glucose: Secondary | ICD-10-CM

## 2018-06-24 DIAGNOSIS — Z136 Encounter for screening for cardiovascular disorders: Secondary | ICD-10-CM | POA: Diagnosis not present

## 2018-06-24 DIAGNOSIS — M19042 Primary osteoarthritis, left hand: Secondary | ICD-10-CM

## 2018-06-24 MED ORDER — TERBINAFINE HCL 250 MG PO TABS: ORAL_TABLET | ORAL | 0 refills | Status: DC

## 2018-06-24 MED ORDER — MELOXICAM 15 MG PO TABS: ORAL_TABLET | ORAL | 3 refills | Status: DC

## 2018-06-25 ENCOUNTER — Other Ambulatory Visit: Payer: Self-pay

## 2018-06-25 ENCOUNTER — Other Ambulatory Visit: Payer: Self-pay | Admitting: Internal Medicine

## 2018-06-25 LAB — COMPLETE METABOLIC PANEL WITH GFR
AG RATIO: 2.3 (calc) (ref 1.0–2.5)
ALBUMIN MSPROF: 4.4 g/dL (ref 3.6–5.1)
ALKALINE PHOSPHATASE (APISO): 42 U/L (ref 37–153)
ALT: 16 U/L (ref 6–29)
AST: 19 U/L (ref 10–35)
BUN: 13 mg/dL (ref 7–25)
CO2: 28 mmol/L (ref 20–32)
Calcium: 9.2 mg/dL (ref 8.6–10.4)
Chloride: 101 mmol/L (ref 98–110)
Creat: 0.82 mg/dL (ref 0.50–0.99)
GFR, Est African American: 88 mL/min/{1.73_m2} (ref >=60)
GFR, Est Non African American: 76 mL/min/{1.73_m2} (ref >=60)
GLUCOSE: 107 mg/dL — AB (ref 65–99)
Globulin: 1.9 g/dL (calc) (ref 1.9–3.7)
POTASSIUM: 4.1 mmol/L (ref 3.5–5.3)
SODIUM: 137 mmol/L (ref 135–146)
Total Bilirubin: 0.3 mg/dL (ref 0.2–1.2)
Total Protein: 6.3 g/dL (ref 6.1–8.1)

## 2018-06-25 LAB — URINALYSIS, ROUTINE W REFLEX MICROSCOPIC
BILIRUBIN URINE: NEGATIVE
Glucose, UA: NEGATIVE
Hgb urine dipstick: NEGATIVE
Ketones, ur: NEGATIVE
Leukocytes,Ua: NEGATIVE
Nitrite: NEGATIVE
Protein, ur: NEGATIVE
SPECIFIC GRAVITY, URINE: 1.007 (ref 1.001–1.03)
pH: 7.5 (ref 5.0–8.0)

## 2018-06-25 LAB — CBC WITH DIFFERENTIAL/PLATELET
ABSOLUTE MONOCYTES: 450 {cells}/uL (ref 200–950)
BASOS PCT: 1.1 %
Basophils Absolute: 63 cells/uL (ref 0–200)
Eosinophils Absolute: 171 cells/uL (ref 15–500)
Eosinophils Relative: 3 %
HEMATOCRIT: 37 % (ref 35.0–45.0)
Hemoglobin: 12.7 g/dL (ref 11.7–15.5)
LYMPHS ABS: 1385 {cells}/uL (ref 850–3900)
MCH: 30.4 pg (ref 27.0–33.0)
MCHC: 34.3 g/dL (ref 32.0–36.0)
MCV: 88.5 fL (ref 80.0–100.0)
MPV: 10.6 fL (ref 7.5–12.5)
Monocytes Relative: 7.9 %
Neutro Abs: 3631 cells/uL (ref 1500–7800)
Neutrophils Relative %: 63.7 %
Platelets: 282 10*3/uL (ref 140–400)
RBC: 4.18 10*6/uL (ref 3.80–5.10)
RDW: 12.3 % (ref 11.0–15.0)
Total Lymphocyte: 24.3 %
WBC: 5.7 10*3/uL (ref 3.8–10.8)

## 2018-06-25 LAB — INSULIN, RANDOM: INSULIN: 4.7 u[IU]/mL

## 2018-06-25 LAB — MICROALBUMIN / CREATININE URINE RATIO: CREATININE, URINE: 15 mg/dL — AB (ref 20–275)

## 2018-06-25 LAB — LIPID PANEL
Cholesterol: 186 mg/dL (ref <200)
HDL: 76 mg/dL (ref >=50)
LDL Cholesterol (Calc): 92 mg/dL (calc)
NON-HDL CHOLESTEROL (CALC): 110 mg/dL (ref <130)
Total CHOL/HDL Ratio: 2.4 (calc) (ref <5.0)
Triglycerides: 88 mg/dL (ref <150)

## 2018-06-25 LAB — MAGNESIUM: MAGNESIUM: 2 mg/dL (ref 1.5–2.5)

## 2018-06-25 LAB — HEMOGLOBIN A1C
HEMOGLOBIN A1C: 5.4 %{Hb} (ref <5.7)
MEAN PLASMA GLUCOSE: 108 (calc)
eAG (mmol/L): 6 (calc)

## 2018-06-25 LAB — IRON, TOTAL/TOTAL IRON BINDING CAP
%SAT: 38 % (calc) (ref 16–45)
IRON: 106 ug/dL (ref 45–160)
TIBC: 276 ug/dL (ref 250–450)

## 2018-06-25 LAB — VITAMIN B12: Vitamin B-12: 1650 pg/mL — ABNORMAL HIGH (ref 200–1100)

## 2018-06-25 LAB — VITAMIN D 25 HYDROXY (VIT D DEFICIENCY, FRACTURES): Vit D, 25-Hydroxy: 84 ng/mL (ref 30–100)

## 2018-06-25 LAB — TSH: TSH: 1.39 mIU/L (ref 0.40–4.50)

## 2018-06-25 MED ORDER — BUTALBITAL-APAP-CAFFEINE 50-325-40 MG PO TABS: ORAL_TABLET | ORAL | 0 refills | Status: DC

## 2018-06-27 ENCOUNTER — Encounter: Payer: Self-pay | Admitting: Internal Medicine

## 2018-06-29 LAB — TB SKIN TEST
Induration: 0 mm
TB SKIN TEST: NEGATIVE

## 2018-07-13 ENCOUNTER — Other Ambulatory Visit: Payer: Self-pay | Admitting: Internal Medicine

## 2018-07-13 DIAGNOSIS — F419 Anxiety disorder, unspecified: Secondary | ICD-10-CM

## 2018-07-13 MED ORDER — ALPRAZOLAM 0.5 MG PO TABS: ORAL_TABLET | ORAL | 0 refills | Status: DC

## 2018-08-17 ENCOUNTER — Other Ambulatory Visit: Payer: Self-pay

## 2018-08-17 DIAGNOSIS — Z1211 Encounter for screening for malignant neoplasm of colon: Secondary | ICD-10-CM

## 2018-08-17 DIAGNOSIS — Z1212 Encounter for screening for malignant neoplasm of rectum: Principal | ICD-10-CM

## 2018-08-17 LAB — POC HEMOCCULT BLD/STL (HOME/3-CARD/SCREEN)
Card #2 Fecal Occult Blod, POC: NEGATIVE
Card #3 Fecal Occult Blood, POC: NEGATIVE
Fecal Occult Blood, POC: NEGATIVE

## 2018-08-18 DIAGNOSIS — Z1211 Encounter for screening for malignant neoplasm of colon: Secondary | ICD-10-CM | POA: Diagnosis not present

## 2018-09-02 ENCOUNTER — Ambulatory Visit
Admission: EM | Admit: 2018-09-02 | Discharge: 2018-09-02 | Disposition: A | Discharge status: Home / Self Care | Payer: BLUE CROSS/BLUE SHIELD | Attending: Emergency Medicine | Admitting: Emergency Medicine

## 2018-09-02 ENCOUNTER — Encounter: Payer: Self-pay | Admitting: Emergency Medicine

## 2018-09-02 ENCOUNTER — Other Ambulatory Visit: Payer: Self-pay

## 2018-09-02 DIAGNOSIS — W228XXA Striking against or struck by other objects, initial encounter: Secondary | ICD-10-CM

## 2018-09-02 DIAGNOSIS — Z23 Encounter for immunization: Secondary | ICD-10-CM | POA: Diagnosis not present

## 2018-09-02 DIAGNOSIS — S0181XA Laceration without foreign body of other part of head, initial encounter: Secondary | ICD-10-CM

## 2018-09-02 MED ORDER — LIDOCAINE-EPINEPHRINE-TETRACAINE (LET) SOLUTION
3.0000 mL | Freq: Once | NASAL | Status: AC
  Administered 2018-09-02: 3 mL via TOPICAL

## 2018-09-02 MED ORDER — TETANUS-DIPHTH-ACELL PERTUSSIS 5-2.5-18.5 LF-MCG/0.5 IM SUSP
0.5000 mL | Freq: Once | INTRAMUSCULAR | Status: AC
  Administered 2018-09-02: 18:00:00 0.5 mL via INTRAMUSCULAR

## 2018-09-02 NOTE — Discharge Instructions (Addendum)
Keep clean and dry. Monitor.  ° °Follow up with your primary care physician this week as needed. Return to Urgent care for new or worsening concerns.  ° °

## 2018-09-02 NOTE — ED Provider Notes (Signed)
[facility] ____________________________________________  Time seen: Approximately 6:52 PM  I have reviewed the triage vital signs and the nursing notes.   HISTORY  Chief Complaint Head Laceration   HPI Briana Charles is a 63 y.o. female presenting for evaluation of mid forehead laceration that occurred approximately 2 to 3 hours prior to arrival.  Patient states she was playing with her dog and bent forward for getting a drawer was pulled out, and accidentally hit her forehead on the corner of the door causing laceration.  States mild pain to laceration site only.  Denies loss of consciousness, fall to the ground, vision changes, dizziness, headache, nausea, vomiting or other complaints.  Reports has kept area clean.  Denies other alleviating measures.  Denies aggravating factors.  Was otherwise doing well.  States tetanus immunization is out of date.  Lucky Cowboy, MD: PCP   Past Medical History:  Diagnosis Date  . Chronic tension headaches   . Depression   . IBS (irritable bowel syndrome)   . Labile hypertension   . Vitamin D deficiency     Patient Active Problem List   Diagnosis Date Noted  . Body mass index (BMI) of 20.0-20.9 in adult 02/13/2015  . Abnormal glucose 02/02/2014  . Mixed hyperlipidemia 02/02/2014  . Medication management 02/02/2014  . IBS (irritable bowel syndrome)   . Labile hypertension   . Vitamin D deficiency   . Depression, controlled   . Chronic tension headaches     Past Surgical History:  Procedure Laterality Date  . ABDOMINAL HYSTERECTOMY     total hysterectomy  . BILATERAL TEMPOROMANDIBULAR JOINT ARTHROPLASTY  1986, 1988  . HEMICOLECTOMY  1991  . TUBAL LIGATION  1981     No current facility-administered medications for this encounter.   Current Outpatient Medications:  .  ALPRAZolam (XANAX) 0.5 MG tablet, Take 1/2 to 1 tablet 2 to 3 x /day ONLY if needed for Anxiety attack, Disp: 60 tablet, Rfl: 0 .  buPROPion  (WELLBUTRIN XL) 300 MG 24 hr tablet, Take 300 mg by mouth daily., Disp: , Rfl:  .  Cholecalciferol (VITAMIN D PO), Take 3,000 Units by mouth daily., Disp: , Rfl:  .  meloxicam (MOBIC) 15 MG tablet, Take 1/2 to 1 tablets Daily with food for Pain & Inflammation, Disp: 90 tablet, Rfl: 3 .  terbinafine (LAMISIL) 250 MG tablet, Take 1 tablet Daily for Toenail Fungus, Disp: 90 tablet, Rfl: 0 .  butalbital-acetaminophen-caffeine (FIORICET, ESGIC) 50-325-40 MG tablet, Take 1 capsule every 4 hours ONLY if needed for painful Headache, Disp: 30 tablet, Rfl: 0 .  MINIVELLE 0.1 MG/24HR patch, , Disp: , Rfl:   Allergies Ciprofloxacin; Luvox [fluvoxamine]; and Sulfa antibiotics  Family History  Problem Relation Age of Onset  . Cancer Mother        lung  . Cancer Father        colon  . Alzheimer's disease Father     Social History Social History   Tobacco Use  . Smoking status: Never Smoker  . Smokeless tobacco: Never Used  Substance Use Topics  . Alcohol use: Yes    Comment: Social  . Drug use: No    Review of Systems Constitutional: No fever Eyes: No visual changes. Cardiovascular: Denies chest pain. Respiratory: Denies shortness of breath. Gastrointestinal: No abdominal pain.  No nausea, no vomiting.   Musculoskeletal: Negative for back pain. Skin: Positive laceration Neurological: Negative for headaches, focal weakness or numbness.    ____________________________________________   PHYSICAL EXAM:  VITAL  SIGNS: ED Triage Vitals  Enc Vitals Group     BP 09/02/18 1743 121/72     Pulse Rate 09/02/18 1743 73     Resp 09/02/18 1743 18     Temp 09/02/18 1743 98.5 F (36.9 C)     Temp Source 09/02/18 1743 Oral     SpO2 09/02/18 1743 100 %     Weight 09/02/18 1739 135 lb (61.2 kg)     Height 09/02/18 1739 5\' 8"  (1.727 m)     Head Circumference --      Peak Flow --      Pain Score 09/02/18 1739 5     Pain Loc --      Pain Edu? --      Excl. in GC? --     Constitutional:  Alert and oriented. Well appearing and in no acute distress. Eyes: Conjunctivae are normal. PERRL. EOMI. no pain with EOMs. ENT      Head: Normocephalic Cardiovascular: Normal rate, regular rhythm. Grossly normal heart sounds.  Good peripheral circulation. Respiratory: Normal respiratory effort without tachypnea nor retractions. Breath sounds are clear and equal bilaterally. No wheezes, rales, rhonchi. Musculoskeletal: Steady gait. Neurologic:  Normal speech and language. No gross focal neurologic deficits are appreciated. Speech is normal. No gait instability.  Skin:  Skin is warm, dry.  Except: Mid forehead 2 cm L-shaped laceration superficial, no active bleeding, mild tenderness to direct palpation, no foreign body, no surrounding erythema, well approximated. Psychiatric: Mood and affect are normal. Speech and behavior are normal. Patient exhibits appropriate insight and judgment   ___________________________________________   LABS (all labs ordered are listed, but only abnormal results are displayed)  Labs Reviewed - No data to display  RADIOLOGY  No results found. ____________________________________________   PROCEDURES Procedures   Procedure(s) performed:  Procedure explained and verbal consent obtained. Consent: Verbal consent obtained. Written consent not obtained. Risks and benefits: risks, benefits and alternatives were discussed Patient identity confirmed: verbally with patient and hospital-assigned identification number  Consent given by: patient   Laceration Repair Location: Forehead Length: 2 cm Foreign bodies: no foreign bodies Tendon involvement: none Nerve involvement: none Preparation: Patient was prepped and draped in the usual sterile fashion. Anesthesia with topical LET Cleaned with Betadine Irrigation solution: saline Irrigation method: jet lavage Amount of cleaning: copious Repaired with Dermabond Patient tolerate well. Wound well approximated  post repair.  Antibiotic ointment and dressing applied.  Wound care instructions provided.  Observe for any signs of infection or other problems.      INITIAL IMPRESSION / ASSESSMENT AND PLAN / ED COURSE  Pertinent labs & imaging results that were available during my care of the patient were reviewed by me and considered in my medical decision making (see chart for details).  Well-appearing patient.  No acute distress.  Forehead laceration.  No focal neurological deficits.  Wound repaired with Dermabond.  Patient tolerated well.  Supportive care.  Tetanus immunization updated.  Discussed follow up with Primary care physician this week. Discussed follow up and return parameters including no resolution or any worsening concerns. Patient verbalized understanding and agreed to plan.   ____________________________________________   FINAL CLINICAL IMPRESSION(S) / ED DIAGNOSES  Final diagnoses:  Laceration of forehead, initial encounter     ED Discharge Orders    None       Note: This dictation was prepared with Dragon dictation along with smaller phrase technology. Any transcriptional errors that result from this process are unintentional.  Renford Dills, NP 09/02/18 2000

## 2018-09-02 NOTE — ED Triage Notes (Signed)
Pt has laceration in the center of her forehead. She hit it on the corner of a drawer in her closet. Happened about 2 hours ago. last tenus vaccine was 01/10/2010. Will update today. Denies dizziness, nausea, vomiting or headache.

## 2018-09-14 ENCOUNTER — Other Ambulatory Visit: Payer: Self-pay | Admitting: Internal Medicine

## 2018-09-14 MED ORDER — BUTALBITAL-APAP-CAFFEINE 50-325-40 MG PO TABS: ORAL_TABLET | ORAL | 0 refills | Status: DC

## 2018-09-18 ENCOUNTER — Other Ambulatory Visit: Payer: BLUE CROSS/BLUE SHIELD | Admitting: Internal Medicine

## 2018-09-18 DIAGNOSIS — N3001 Acute cystitis with hematuria: Secondary | ICD-10-CM | POA: Diagnosis not present

## 2018-09-18 MED ORDER — NITROFURANTOIN MONOHYD MACRO 100 MG PO CAPS: ORAL_CAPSULE | ORAL | 1 refills | Status: DC

## 2018-09-18 NOTE — Progress Notes (Signed)
THIS ENCOUNTER IS A VIRTUAL VISIT DUE TO COVID-19 - PATIENT WAS NOT SEEN IN THE OFFICE.  PATIENT HAS CONSENTED TO VIRTUAL VISIT / TELEMEDICINE VISIT   Virtual Visit via telephone Note  I connected with  Briana Charles   on 09/18/2018 09/18/2018  by telephone.  I verified that I am speaking with the correct person using two identifiers.    I discussed the limitations of evaluation and management by telemedicine and the availability of in person appointments. The patient expressed understanding and agreed to proceed.  History of Present Illness:       This very nice 63 y.o. MWF with labile HTN, HLD, glucose intolerance  and Vitamin D Deficiency called emergantly with a 2-3 day hx/o "pressure " from her bladder and then this morning had blood in her urine. Has prior hx/o UTI & PyloNephritis Denies fever , chill, sweats, rash, back pain, N/V and no respiratory or GI sx's.   Medications  .  MINIVELLE 0.1 MG/24HR patch,  .  butalbital-acetaminophen-caffeine (FIORICET) 50-325-40 MG tablet, Take 1 capsule every 4 hours ONLY if needed for painful Headache .  meloxicam (MOBIC) 15 MG tablet, Take 1/2 to 1 tablets Daily with food for Pain & Inflammation .  ALPRAZolam  0.5 MG tablet, Take 1/2 to 1 tablet 2 to 3 x /day ONLY if needed for Anxiety attack .  buPROPion (WELLBUTRIN XL) 300 MG 24 hr tablet, Take 300 mg by mouth daily. .  Cholecalciferol (VITAMIN D PO), Take 3,000 Units by mouth daily. Marland Kitchen  terbinafine (LAMISIL) 250 MG tablet, Take 1 tablet Daily for Toenail Fungus  Problem list She has IBS (irritable bowel syndrome); Labile hypertension; Vitamin D deficiency; Depression, controlled; Chronic tension headaches; Abnormal glucose; Mixed hyperlipidemia; Medication management; and Body mass index (BMI) of 20.0-20.9 in adult on their problem list.   Observations/Objective:  General : Well sounding patient in no apparent distress HEENT: no hoarseness, no cough for duration of visit Lungs: speaks in  complete sentences, no audible wheezing, no apparent distress Neurological: alert, oriented x 3 Psychiatric: pleasant, judgement appropriate   Assessment and Plan:  1. Acute cystitis with hematuria  - nitrofurantoin, macrocrystal-monohydrate, (MACROBID) 100 MG capsule; Take 1 capsule 2 x /day for UTI  Dispense: 14 capsule; Refill: 1  Follow Up Instructions:  - encouraged liberal fluid intake and encouraged call-back if problems.        I discussed the assessment and treatment plan with the patient. The patient was provided an opportunity to ask questions and all were answered. The patient agreed with the plan and demonstrated an understanding of the instructions.       The patient was advised to call back or seek an in-person evaluation if the symptoms worsen or if the condition fails to improve as anticipated.  I provided  17 minutes of non-face-to-face time during this encounter.   Marinus Maw, MD

## 2018-10-06 DIAGNOSIS — Z1231 Encounter for screening mammogram for malignant neoplasm of breast: Secondary | ICD-10-CM | POA: Diagnosis not present

## 2018-10-06 DIAGNOSIS — Z01419 Encounter for gynecological examination (general) (routine) without abnormal findings: Secondary | ICD-10-CM | POA: Diagnosis not present

## 2018-10-06 DIAGNOSIS — Z6821 Body mass index (BMI) 21.0-21.9, adult: Secondary | ICD-10-CM | POA: Diagnosis not present

## 2018-10-07 ENCOUNTER — Other Ambulatory Visit: Payer: Self-pay | Admitting: Obstetrics & Gynecology

## 2018-10-07 DIAGNOSIS — R928 Other abnormal and inconclusive findings on diagnostic imaging of breast: Secondary | ICD-10-CM

## 2018-10-12 ENCOUNTER — Other Ambulatory Visit: Payer: Self-pay

## 2018-10-12 ENCOUNTER — Ambulatory Visit
Admission: RE | Admit: 2018-10-12 | Discharge: 2018-10-12 | Disposition: A | Discharge status: Home / Self Care | Payer: BC Managed Care – PPO | Source: Ambulatory Visit | Attending: Obstetrics & Gynecology | Admitting: Obstetrics & Gynecology

## 2018-10-12 ENCOUNTER — Ambulatory Visit: Payer: BC Managed Care – PPO

## 2018-10-12 DIAGNOSIS — R928 Other abnormal and inconclusive findings on diagnostic imaging of breast: Secondary | ICD-10-CM

## 2018-12-07 ENCOUNTER — Other Ambulatory Visit: Payer: Self-pay | Admitting: Internal Medicine

## 2018-12-07 DIAGNOSIS — G44229 Chronic tension-type headache, not intractable: Secondary | ICD-10-CM

## 2018-12-07 MED ORDER — BUTALBITAL-APAP-CAFFEINE 50-325-40 MG PO TABS: ORAL_TABLET | ORAL | 0 refills | Status: DC

## 2018-12-28 ENCOUNTER — Other Ambulatory Visit: Payer: Self-pay | Admitting: Internal Medicine

## 2018-12-28 MED ORDER — AMOXICILLIN 250 MG PO CAPS: ORAL_CAPSULE | ORAL | 0 refills | Status: DC

## 2019-01-18 ENCOUNTER — Other Ambulatory Visit: Payer: Self-pay | Admitting: Internal Medicine

## 2019-01-18 DIAGNOSIS — R300 Dysuria: Secondary | ICD-10-CM

## 2019-02-15 DIAGNOSIS — N6459 Other signs and symptoms in breast: Secondary | ICD-10-CM | POA: Diagnosis not present

## 2019-03-17 DIAGNOSIS — Z20828 Contact with and (suspected) exposure to other viral communicable diseases: Secondary | ICD-10-CM | POA: Diagnosis not present

## 2019-03-18 ENCOUNTER — Other Ambulatory Visit: Payer: Self-pay | Admitting: Internal Medicine

## 2019-03-18 DIAGNOSIS — G44229 Chronic tension-type headache, not intractable: Secondary | ICD-10-CM

## 2019-03-18 MED ORDER — BUTALBITAL-APAP-CAFFEINE 50-325-40 MG PO TABS: ORAL_TABLET | ORAL | 0 refills | Status: DC

## 2019-05-24 ENCOUNTER — Other Ambulatory Visit: Payer: Self-pay | Admitting: Internal Medicine

## 2019-05-24 DIAGNOSIS — G44229 Chronic tension-type headache, not intractable: Secondary | ICD-10-CM

## 2019-05-24 MED ORDER — BUTALBITAL-APAP-CAFFEINE 50-325-40 MG PO TABS: ORAL_TABLET | ORAL | 0 refills | Status: DC

## 2019-05-25 DIAGNOSIS — M25551 Pain in right hip: Secondary | ICD-10-CM | POA: Diagnosis not present

## 2019-06-01 DIAGNOSIS — L249 Irritant contact dermatitis, unspecified cause: Secondary | ICD-10-CM | POA: Diagnosis not present

## 2019-06-01 DIAGNOSIS — Z85828 Personal history of other malignant neoplasm of skin: Secondary | ICD-10-CM | POA: Diagnosis not present

## 2019-06-01 DIAGNOSIS — L578 Other skin changes due to chronic exposure to nonionizing radiation: Secondary | ICD-10-CM | POA: Diagnosis not present

## 2019-06-01 DIAGNOSIS — Z872 Personal history of diseases of the skin and subcutaneous tissue: Secondary | ICD-10-CM | POA: Diagnosis not present

## 2019-07-13 DIAGNOSIS — M13842 Other specified arthritis, left hand: Secondary | ICD-10-CM | POA: Diagnosis not present

## 2019-07-13 DIAGNOSIS — M533 Sacrococcygeal disorders, not elsewhere classified: Secondary | ICD-10-CM | POA: Diagnosis not present

## 2019-07-26 ENCOUNTER — Ambulatory Visit (INDEPENDENT_AMBULATORY_CARE_PROVIDER_SITE_OTHER): Payer: BC Managed Care – PPO | Admitting: Internal Medicine

## 2019-07-26 ENCOUNTER — Other Ambulatory Visit: Payer: Self-pay

## 2019-07-26 VITALS — BP 130/72 | HR 72 | Temp 97.1°F | Resp 16 | Ht 67.5 in | Wt 139.8 lb

## 2019-07-26 DIAGNOSIS — I10 Essential (primary) hypertension: Secondary | ICD-10-CM | POA: Diagnosis not present

## 2019-07-26 DIAGNOSIS — F419 Anxiety disorder, unspecified: Secondary | ICD-10-CM | POA: Insufficient documentation

## 2019-07-26 DIAGNOSIS — Z13220 Encounter for screening for lipoid disorders: Secondary | ICD-10-CM | POA: Diagnosis not present

## 2019-07-26 DIAGNOSIS — Z1329 Encounter for screening for other suspected endocrine disorder: Secondary | ICD-10-CM | POA: Diagnosis not present

## 2019-07-26 DIAGNOSIS — Z79899 Other long term (current) drug therapy: Secondary | ICD-10-CM | POA: Diagnosis not present

## 2019-07-26 DIAGNOSIS — Z111 Encounter for screening for respiratory tuberculosis: Secondary | ICD-10-CM | POA: Insufficient documentation

## 2019-07-26 DIAGNOSIS — R0989 Other specified symptoms and signs involving the circulatory and respiratory systems: Secondary | ICD-10-CM | POA: Diagnosis not present

## 2019-07-26 DIAGNOSIS — R7309 Other abnormal glucose: Secondary | ICD-10-CM

## 2019-07-26 DIAGNOSIS — Z8249 Family history of ischemic heart disease and other diseases of the circulatory system: Secondary | ICD-10-CM

## 2019-07-26 DIAGNOSIS — Z0000 Encounter for general adult medical examination without abnormal findings: Secondary | ICD-10-CM | POA: Diagnosis not present

## 2019-07-26 DIAGNOSIS — E782 Mixed hyperlipidemia: Secondary | ICD-10-CM

## 2019-07-26 DIAGNOSIS — Z1389 Encounter for screening for other disorder: Secondary | ICD-10-CM | POA: Diagnosis not present

## 2019-07-26 DIAGNOSIS — Z130 Encounter for screening for diseases of the blood and blood-forming organs and certain disorders involving the immune mechanism: Secondary | ICD-10-CM | POA: Diagnosis not present

## 2019-07-26 DIAGNOSIS — Z136 Encounter for screening for cardiovascular disorders: Secondary | ICD-10-CM

## 2019-07-26 DIAGNOSIS — E559 Vitamin D deficiency, unspecified: Secondary | ICD-10-CM | POA: Diagnosis not present

## 2019-07-26 DIAGNOSIS — Z131 Encounter for screening for diabetes mellitus: Secondary | ICD-10-CM | POA: Diagnosis not present

## 2019-07-26 DIAGNOSIS — R5383 Other fatigue: Secondary | ICD-10-CM

## 2019-07-26 DIAGNOSIS — G44229 Chronic tension-type headache, not intractable: Secondary | ICD-10-CM

## 2019-07-26 DIAGNOSIS — F411 Generalized anxiety disorder: Secondary | ICD-10-CM | POA: Insufficient documentation

## 2019-07-26 DIAGNOSIS — Z0001 Encounter for general adult medical examination with abnormal findings: Secondary | ICD-10-CM

## 2019-07-26 DIAGNOSIS — Z1211 Encounter for screening for malignant neoplasm of colon: Secondary | ICD-10-CM

## 2019-07-26 NOTE — Patient Instructions (Signed)

## 2019-07-26 NOTE — Progress Notes (Signed)
Annual Screening/Preventative Visit & Comprehensive Evaluation &  Examination     This very nice 64 y.o. MWF presents for a Screening /Preventative Visit & comprehensive evaluation and management of multiple medical co-morbidities.  Patient has been followed & screened for HTN, HLD, Prediabetes  and Vitamin D Deficiency.       Patient's BP has been controlled at home and patient denies any cardiac symptoms as chest pain, palpitations, shortness of breath, dizziness or ankle swelling. Today's BP is at goal -130/72      Patient's hyperlipidemia is controlled with diet. Last lipids were at goal:  Lab Results  Component Value Date   CHOL 186 06/24/2018   HDL 76 06/24/2018   LDLCALC 92 06/24/2018   TRIG 88 06/24/2018   CHOLHDL 2.4 06/24/2018       Patient  has been proactively monitored for glucose intolerance & prediabetes and patient denies reactive hypoglycemic symptoms, visual blurring, diabetic polys or paresthesias. Last A1c was Normal & at goal:  Lab Results  Component Value Date   HGBA1C 5.4 06/24/2018       Finally, patient has history of Vitamin D Deficiency and last Vitamin D was at goal:  Lab Results  Component Value Date   VD25OH 84 06/24/2018    Current Outpatient Medications on File Prior to Visit  Medication Sig  . ALPRAZolam ( 0.5 MG tablet Take 1/2 to 1 tablet 2 to 3 x /day ONLY if needed for Anxiety attack  . buPROPion-XL 300 MG  Take 300 mg by mouth daily.  Marland Kitchen FIORICET Take 1 capsule every 4 hours ONLY if needed for painful Headache  . VITAMIN D  Take 2,000 Units by mouth daily.   . meloxicam (MOBIC) 15 MG  Take 1/2 to 1 tablets Daily with food for Pain & Inflammation  . MINIVELLE 0.1 MG/24HR     Allergies  Allergen Reactions  . Ciprofloxacin     headaches  . Luvox [Fluvoxamine] Nausea And Vomiting  . Sulfa Antibiotics Rash   Past Medical History:  Diagnosis Date  . Chronic tension headaches   . Depression   . IBS (irritable bowel syndrome)   .  Labile hypertension   . Vitamin D deficiency    Health Maintenance  Topic Date Due  . MAMMOGRAM  08/11/2009  . PAP SMEAR-Modifier  08/21/2017  . INFLUENZA VACCINE  11/14/2019  . COLONOSCOPY  08/02/2026  . TETANUS/TDAP  09/01/2028  . Hepatitis C Screening  Completed  . HIV Screening  Completed   Immunization History  Administered Date(s) Administered  . Influenza Split 02/02/2014, 02/13/2015  . Influenza,inj,Quad PF,6+ Mos 01/12/2019  . Influenza,inj,quad, With Preservative 03/19/2016  . Moderna SARS-COVID-2 Vaccination 06/22/2019, 07/20/2019  . PPD Test 02/02/2014, 02/13/2015, 03/19/2016, 05/23/2017, 06/24/2018, 07/26/2019  . Pneumococcal-Unspecified 01/27/2012  . Tdap 01/10/2010, 09/02/2018    Last Colon - 04.12.2018 - Dr Katheran Awe - recommended 10 yr f/u in Apr 2028  Last MGM - 10/12/2019  Past Surgical History:  Procedure Laterality Date  . ABDOMINAL HYSTERECTOMY     total hysterectomy  . BILATERAL TEMPOROMANDIBULAR JOINT ARTHROPLASTY  1986, 1988  . HEMICOLECTOMY  1991  . TUBAL LIGATION  1981   Family History  Problem Relation Age of Onset  . Cancer Mother        lung  . Cancer Father        colon  . Alzheimer's disease Father    Social History   Tobacco Use  . Smoking status: Never Smoker  . Smokeless tobacco: Never  Used  Substance Use Topics  . Alcohol use: Yes    Comment: Social  . Drug use: No    ROS Constitutional: Denies fever, chills, weight loss/gain, headaches, insomnia,  night sweats, and change in appetite. Does c/o fatigue. Eyes: Denies redness, blurred vision, diplopia, discharge, itchy, watery eyes.  ENT: Denies discharge, congestion, post nasal drip, epistaxis, sore throat, earache, hearing loss, dental pain, Tinnitus, Vertigo, Sinus pain, snoring.  Cardio: Denies chest pain, palpitations, irregular heartbeat, syncope, dyspnea, diaphoresis, orthopnea, PND, claudication, edema Respiratory: denies cough, dyspnea, DOE, pleurisy, hoarseness,  laryngitis, wheezing.  Gastrointestinal: Denies dysphagia, heartburn, reflux, water brash, pain, cramps, nausea, vomiting, bloating, diarrhea, constipation, hematemesis, melena, hematochezia, jaundice, hemorrhoids Genitourinary: Denies dysuria, frequency, urgency, nocturia, hesitancy, discharge, hematuria, flank pain Breast: Breast lumps, nipple discharge, bleeding.  Musculoskeletal: Denies arthralgia, myalgia, stiffness, Jt. Swelling, pain, limp, and strain/sprain. Denies falls. Skin: Denies puritis, rash, hives, warts, acne, eczema, changing in skin lesion Neuro: No weakness, tremor, incoordination, spasms, paresthesia, pain Psychiatric: Denies confusion, memory loss, sensory loss. Denies Depression. Endocrine: Denies change in weight, skin, hair change, nocturia, and paresthesia, diabetic polys, visual blurring, hyper / hypo glycemic episodes.  Heme/Lymph: No excessive bleeding, bruising, enlarged lymph nodes.  Physical Exam  BP 130/72   Pulse 72   Temp (!) 97.1 F (36.2 C)   Resp 16   Ht 5' 7.5" (1.715 m)   Wt 139 lb 12.8 oz (63.4 kg)   BMI 21.57 kg/m   General Appearance: Well nourished, well groomed and in no apparent distress.  Eyes: PERRLA, EOMs, conjunctiva no swelling or erythema, normal fundi and vessels. Sinuses: No frontal/maxillary tenderness ENT/Mouth: EACs patent / TMs  nl. Nares clear without erythema, swelling, mucoid exudates. Oral hygiene is good. No erythema, swelling, or exudate. Tongue normal, non-obstructing. Tonsils not swollen or erythematous. Hearing normal.  Neck: Supple, thyroid not palpable. No bruits, nodes or JVD. Respiratory: Respiratory effort normal.  BS equal and clear bilateral without rales, rhonci, wheezing or stridor. Cardio: Heart sounds are normal with regular rate and rhythm and no murmurs, rubs or gallops. Peripheral pulses are normal and equal bilaterally without edema. No aortic or femoral bruits. Chest: symmetric with normal excursions and  percussion. Breasts: Symmetric, without lumps, nipple discharge, retractions, or fibrocystic changes.  Abdomen: Flat, soft with bowel sounds active. Nontender, no guarding, rebound, hernias, masses, or organomegaly.  Lymphatics: Non tender without lymphadenopathy.  Genitourinary:  Musculoskeletal: Full ROM all peripheral extremities, joint stability, 5/5 strength, and normal gait. Skin: Warm and dry without rashes, lesions, cyanosis, clubbing or  ecchymosis.  Neuro: Cranial nerves intact, reflexes equal bilaterally. Normal muscle tone, no cerebellar symptoms. Sensation intact.  Pysch: Alert and oriented X 3, normal affect, Insight and Judgment appropriate.   Assessment and Plan  1. Annual Preventative Screening Examination  2. Labile hypertension  - Urinalysis, Routine w reflex microscopic - Microalbumin / creatinine urine ratio - CBC with Differential/Platelet - Magnesium - COMPLETE METABOLIC PANEL WITH GFR - Insulin, random - EKG 12-Lead - [place], RETROPERITNL ABD,  LTD  3. Hyperlipidemia, mixed  - Lipid panel - TSH - EKG 12-Lead - [place], RETROPERITNL ABD,  LTD  4. Abnormal glucose  - Hemoglobin A1c - EKG 12-Lead - [place], RETROPERITNL ABD,  LTD  5. Vitamin D deficiency  - VITAMIN D 25 Hydroxy   6. Anxiety tension state   7. Chronic tension-type headache, not intractable   8. Screening examination for pulmonary tuberculosis  - PPD  9. Screening for colorectal cancer  - POC  Hemoccult Bld/Stl   10. Screening for ischemic heart disease  - EKG 12-Lead  11. FHx: heart disease  - EKG 12-Lead - [place], RETROPERITNL ABD,  LTD  12. Screening for AAA (aortic abdominal aneurysm)  - [place], RETROPERITNL ABD,  LTD  13. Fatigue, unspecified type  - Iron,Total/Total Iron Binding Cap - Vitamin B12  14. Medication management  - Urinalysis, Routine w reflex microscopic - Microalbumin / creatinine urine ratio - CBC with Differential/Platelet - Magnesium - COMPLETE  METABOLIC PANEL WITH GFR - Insulin, random - VITAMIN D 25 Hydroxy (Vit-D Deficiency, Fractures) - Lipid panel - TSH - Hemoglobin A1c       Patient was counseled in prudent diet to achieve/maintain BMI less than 25 for weight control, BP monitoring, regular exercise and medications. Discussed med's effects and SE's. Screening labs and tests as requested with regular follow-up as recommended. Over 40 minutes of exam, counseling, chart review and high complex critical decision making was performed.   Marinus Maw, MD

## 2019-07-27 LAB — URINALYSIS, ROUTINE W REFLEX MICROSCOPIC
Bilirubin Urine: NEGATIVE
Glucose, UA: NEGATIVE
Hgb urine dipstick: NEGATIVE
Ketones, ur: NEGATIVE
Leukocytes,Ua: NEGATIVE
Nitrite: NEGATIVE
Protein, ur: NEGATIVE
Specific Gravity, Urine: 1.006 (ref 1.001–1.03)
pH: 7.5 (ref 5.0–8.0)

## 2019-07-27 LAB — COMPLETE METABOLIC PANEL WITH GFR
AG Ratio: 1.9 (calc) (ref 1.0–2.5)
ALT: 15 U/L (ref 6–29)
AST: 21 U/L (ref 10–35)
Albumin: 4.4 g/dL (ref 3.6–5.1)
Alkaline phosphatase (APISO): 40 U/L (ref 37–153)
BUN: 15 mg/dL (ref 7–25)
CO2: 30 mmol/L (ref 20–32)
Calcium: 9.2 mg/dL (ref 8.6–10.4)
Chloride: 103 mmol/L (ref 98–110)
Creat: 0.73 mg/dL (ref 0.50–0.99)
GFR, Est African American: 101 mL/min/{1.73_m2} (ref >=60)
GFR, Est Non African American: 87 mL/min/{1.73_m2} (ref >=60)
Globulin: 2.3 g/dL (calc) (ref 1.9–3.7)
Glucose, Bld: 93 mg/dL (ref 65–99)
Potassium: 4 mmol/L (ref 3.5–5.3)
Sodium: 140 mmol/L (ref 135–146)
Total Bilirubin: 0.3 mg/dL (ref 0.2–1.2)
Total Protein: 6.7 g/dL (ref 6.1–8.1)

## 2019-07-27 LAB — CBC WITH DIFFERENTIAL/PLATELET
Absolute Monocytes: 532 cells/uL (ref 200–950)
Basophils Absolute: 62 cells/uL (ref 0–200)
Basophils Relative: 1.1 %
Eosinophils Absolute: 370 cells/uL (ref 15–500)
Eosinophils Relative: 6.6 %
HCT: 39.3 % (ref 35.0–45.0)
Hemoglobin: 13.3 g/dL (ref 11.7–15.5)
Lymphs Abs: 1350 cells/uL (ref 850–3900)
MCH: 30 pg (ref 27.0–33.0)
MCHC: 33.8 g/dL (ref 32.0–36.0)
MCV: 88.7 fL (ref 80.0–100.0)
MPV: 10.8 fL (ref 7.5–12.5)
Monocytes Relative: 9.5 %
Neutro Abs: 3287 cells/uL (ref 1500–7800)
Neutrophils Relative %: 58.7 %
Platelets: 271 10*3/uL (ref 140–400)
RBC: 4.43 10*6/uL (ref 3.80–5.10)
RDW: 12.3 % (ref 11.0–15.0)
Total Lymphocyte: 24.1 %
WBC: 5.6 10*3/uL (ref 3.8–10.8)

## 2019-07-27 LAB — IRON, TOTAL/TOTAL IRON BINDING CAP
%SAT: 35 % (calc) (ref 16–45)
Iron: 111 ug/dL (ref 45–160)
TIBC: 320 mcg/dL (calc) (ref 250–450)

## 2019-07-27 LAB — LIPID PANEL
Cholesterol: 195 mg/dL (ref <200)
HDL: 83 mg/dL (ref >=50)
LDL Cholesterol (Calc): 98 mg/dL (calc)
Non-HDL Cholesterol (Calc): 112 mg/dL (calc) (ref <130)
Total CHOL/HDL Ratio: 2.3 (calc) (ref <5.0)
Triglycerides: 55 mg/dL (ref <150)

## 2019-07-27 LAB — MAGNESIUM: Magnesium: 2.2 mg/dL (ref 1.5–2.5)

## 2019-07-27 LAB — VITAMIN B12: Vitamin B-12: 560 pg/mL (ref 200–1100)

## 2019-07-27 LAB — MICROALBUMIN / CREATININE URINE RATIO
Creatinine, Urine: 15 mg/dL — ABNORMAL LOW (ref 20–275)
Microalb, Ur: <0.2 mg/dL

## 2019-07-27 LAB — INSULIN, RANDOM: Insulin: 1.4 u[IU]/mL

## 2019-07-27 LAB — HEMOGLOBIN A1C
Hgb A1c MFr Bld: 5.2 % of total Hgb (ref <5.7)
Mean Plasma Glucose: 103 (calc)
eAG (mmol/L): 5.7 (calc)

## 2019-07-27 LAB — TSH: TSH: 1.53 mIU/L (ref 0.40–4.50)

## 2019-07-27 LAB — VITAMIN D 25 HYDROXY (VIT D DEFICIENCY, FRACTURES): Vit D, 25-Hydroxy: 86 ng/mL (ref 30–100)

## 2019-07-27 MED ORDER — TOPIRAMATE 50 MG PO TABS: ORAL_TABLET | ORAL | 1 refills | Status: DC

## 2019-07-28 ENCOUNTER — Telehealth: Payer: Self-pay | Admitting: *Deleted

## 2019-07-28 NOTE — Telephone Encounter (Signed)
Hemoccult card mailed to patient.

## 2019-08-01 ENCOUNTER — Encounter: Payer: Self-pay | Admitting: Internal Medicine

## 2019-08-12 LAB — TB SKIN TEST
Induration: 0 mm
TB Skin Test: NEGATIVE

## 2019-08-16 ENCOUNTER — Other Ambulatory Visit: Payer: Self-pay | Admitting: Internal Medicine

## 2019-08-16 DIAGNOSIS — K589 Irritable bowel syndrome without diarrhea: Secondary | ICD-10-CM

## 2019-08-16 MED ORDER — DICYCLOMINE HCL 20 MG PO TABS: ORAL_TABLET | ORAL | 1 refills | Status: DC

## 2019-08-17 ENCOUNTER — Other Ambulatory Visit: Payer: Self-pay | Admitting: Internal Medicine

## 2019-08-17 DIAGNOSIS — G44229 Chronic tension-type headache, not intractable: Secondary | ICD-10-CM

## 2019-08-17 MED ORDER — BUTALBITAL-APAP-CAFFEINE 50-325-40 MG PO TABS: ORAL_TABLET | ORAL | 0 refills | Status: DC

## 2019-10-13 DIAGNOSIS — Z6821 Body mass index (BMI) 21.0-21.9, adult: Secondary | ICD-10-CM | POA: Diagnosis not present

## 2019-10-13 DIAGNOSIS — Z1231 Encounter for screening mammogram for malignant neoplasm of breast: Secondary | ICD-10-CM | POA: Diagnosis not present

## 2019-10-13 DIAGNOSIS — Z01419 Encounter for gynecological examination (general) (routine) without abnormal findings: Secondary | ICD-10-CM | POA: Diagnosis not present

## 2019-11-13 ENCOUNTER — Other Ambulatory Visit: Payer: Self-pay | Admitting: Internal Medicine

## 2019-11-13 DIAGNOSIS — G44229 Chronic tension-type headache, not intractable: Secondary | ICD-10-CM

## 2020-02-01 ENCOUNTER — Other Ambulatory Visit: Payer: Self-pay

## 2020-02-01 DIAGNOSIS — F419 Anxiety disorder, unspecified: Secondary | ICD-10-CM

## 2020-02-01 MED ORDER — ALPRAZOLAM 0.5 MG PO TABS: ORAL_TABLET | ORAL | 0 refills | Status: DC

## 2020-03-20 ENCOUNTER — Other Ambulatory Visit: Payer: Self-pay | Admitting: Internal Medicine

## 2020-03-20 DIAGNOSIS — G44229 Chronic tension-type headache, not intractable: Secondary | ICD-10-CM

## 2020-05-09 ENCOUNTER — Other Ambulatory Visit: Payer: Self-pay

## 2020-05-09 DIAGNOSIS — Z1211 Encounter for screening for malignant neoplasm of colon: Secondary | ICD-10-CM

## 2020-05-09 LAB — POC HEMOCCULT BLD/STL (HOME/3-CARD/SCREEN)
Card #2 Fecal Occult Blod, POC: NEGATIVE
Card #3 Fecal Occult Blood, POC: NEGATIVE
Fecal Occult Blood, POC: NEGATIVE

## 2020-05-10 DIAGNOSIS — Z1211 Encounter for screening for malignant neoplasm of colon: Secondary | ICD-10-CM | POA: Diagnosis not present

## 2020-05-10 DIAGNOSIS — Z1212 Encounter for screening for malignant neoplasm of rectum: Secondary | ICD-10-CM | POA: Diagnosis not present

## 2020-06-07 DIAGNOSIS — L578 Other skin changes due to chronic exposure to nonionizing radiation: Secondary | ICD-10-CM | POA: Diagnosis not present

## 2020-06-07 DIAGNOSIS — L853 Xerosis cutis: Secondary | ICD-10-CM | POA: Diagnosis not present

## 2020-06-07 DIAGNOSIS — Z85828 Personal history of other malignant neoplasm of skin: Secondary | ICD-10-CM | POA: Diagnosis not present

## 2020-06-07 DIAGNOSIS — Z872 Personal history of diseases of the skin and subcutaneous tissue: Secondary | ICD-10-CM | POA: Diagnosis not present

## 2020-07-14 ENCOUNTER — Other Ambulatory Visit: Payer: Self-pay | Admitting: Internal Medicine

## 2020-07-14 DIAGNOSIS — G44229 Chronic tension-type headache, not intractable: Secondary | ICD-10-CM

## 2020-08-01 ENCOUNTER — Encounter: Payer: Self-pay | Admitting: Internal Medicine

## 2020-08-01 NOTE — Progress Notes (Signed)
Annual Screening/Preventative Visit & Comprehensive Evaluation &  Examination  Future Appointments  Date Time Provider Department Center  08/02/2020 10:00 AM Lucky Cowboy, MD [facility] None  08/06/2021 10:00 AM Lucky Cowboy, MD [facility] None        This very nice 65 y.o. MWF presents for a Screening /Preventative Visit & comprehensive evaluation and management of multiple medical co-morbidities.  Patient has been expectantly followed  /screened for HTN, HLD, Prediabetes  and Vitamin D Deficiency.       Patient's BP has been controlled at home and patient denies any cardiac symptoms as chest pain, palpitations, shortness of breath, dizziness or ankle swelling. Today's BP is at goal  -  120/70.       Patient's lipids are controlled with diet. Last lipids were at goal:  Lab Results  Component Value Date   CHOL 195 07/26/2019   HDL 83 07/26/2019   LDLCALC 98 07/26/2019   TRIG 55 07/26/2019   CHOLHDL 2.3 07/26/2019        Patient is monitored expectantly for abnormal glucose /glucose intolerance  and patient denies reactive hypoglycemic symptoms, visual blurring, diabetic polys or paresthesias. Last A1c was normal & at goal:  Lab Results  Component Value Date   HGBA1C 5.2 07/26/2019        Finally, patient has history of Vitamin D Deficiency and last Vitamin D was at goal:  Lab Results  Component Value Date   VD25OH 86 07/26/2019    Current Outpatient Medications on File Prior to Visit  Medication Sig  . ALPRAZolam 0.5 MG tablet Take 1/2 to 1 tablet 2 to 3 x /day ONLY if needed         . buPROPion-XL 300 MG  Take 300 mg  daily.  Marland Kitchen FIORICET 50-325-40 MG tablet Take 1 Tablet every 4 Hrs as  needed for  severe HA  . VITAMIN D 5,000 Units   Take  daily.  Marland Kitchen MINIVELLE 0.1 MG/24HR patch Daily     Allergies  Allergen Reactions  . Ciprofloxacin     headaches  . Luvox [Fluvoxamine] Nausea And Vomiting  . Sulfa Antibiotics Rash    Past Medical History:   Diagnosis Date  . Chronic tension headaches   . Depression   . IBS (irritable bowel syndrome)   . Labile hypertension   . Vitamin D deficiency     Health Maintenance  Topic Date Due  . MAMMOGRAM  08/11/2009  . PAP SMEAR-Modifier  08/21/2017  . DEXA SCAN  Never done  . PNA vac Low Risk Adult (1 of 2 - PCV13) 12/12/2019  . COVID-19 Vaccine (3 - Booster for Moderna series) 01/19/2020  . INFLUENZA VACCINE  11/13/2020  . COLONOSCOPY  08/02/2026  . TETANUS/TDAP  09/01/2028  . Hepatitis C Screening  Completed  . HIV Screening  Completed  . HPV VACCINES  Aged Out    Immunization History  Administered Date(s) Administered  . Influenza Split 02/02/2014, 02/13/2015  . Influenza,inj,Quad PF,6+ Mos 01/12/2019  . Influenza,inj,quad, With Preservative 03/19/2016  . Moderna Sars-Covid-2 Vaccination 06/22/2019, 07/20/2019  . PPD Test 02/02/2014, 02/13/2015, 03/19/2016, 05/23/2017, 06/24/2018, 07/26/2019  . Pneumococcal-Unspecified 01/27/2012  . Tdap 01/10/2010, 09/02/2018    Last Colon - 04.12.2018 - Dr Joya San - recommended 10 yr f/u in Apr 2028  Last MGM - 10/12/2019  Past Surgical History:  Procedure Laterality Date  . ABDOMINAL HYSTERECTOMY     total hysterectomy  . BILATERAL TEMPOROMANDIBULAR JOINT ARTHROPLASTY  1986, 1988  . HEMICOLECTOMY  1991  . TUBAL LIGATION  1981    Family History  Problem Relation Age of Onset  . Cancer Mother        lung  . Cancer Father        colon  . Alzheimer's disease Father     Social History   Tobacco Use  . Smoking status: Never Smoker  . Smokeless tobacco: Never Used  Vaping Use  . Vaping Use: Never used  Substance Use Topics  . Alcohol use: Yes    Comment: Social  . Drug use: No    ROS Constitutional: Denies fever, chills, weight loss/gain, headaches, insomnia,  night sweats, and change in appetite. Does c/o fatigue. Eyes: Denies redness, blurred vision, diplopia, discharge, itchy, watery eyes.  ENT: Denies discharge,  congestion, post nasal drip, epistaxis, sore throat, earache, hearing loss, dental pain, Tinnitus, Vertigo, Sinus pain, snoring.  Cardio: Denies chest pain, palpitations, irregular heartbeat, syncope, dyspnea, diaphoresis, orthopnea, PND, claudication, edema Respiratory: denies cough, dyspnea, DOE, pleurisy, hoarseness, laryngitis, wheezing.  Gastrointestinal: Denies dysphagia, heartburn, reflux, water brash, pain, cramps, nausea, vomiting, bloating, diarrhea, constipation, hematemesis, melena, hematochezia, jaundice, hemorrhoids Genitourinary: Denies dysuria, frequency, urgency, nocturia, hesitancy, discharge, hematuria, flank pain Breast: Breast lumps, nipple discharge, bleeding.  Musculoskeletal: Denies arthralgia, myalgia, stiffness, Jt. Swelling, pain, limp, and strain/sprain. Denies falls. Skin: Denies puritis, rash, hives, warts, acne, eczema, changing in skin lesion Neuro: No weakness, tremor, incoordination, spasms, paresthesia, pain Psychiatric: Denies confusion, memory loss, sensory loss. Denies Depression. Endocrine: Denies change in weight, skin, hair change, nocturia, and paresthesia, diabetic polys, visual blurring, hyper / hypo glycemic episodes.  Heme/Lymph: No excessive bleeding, bruising, enlarged lymph nodes.  Physical Exam  BP 120/70   Pulse 68   Temp 97.8 F (36.6 C)   Resp 16   Ht 5' 7.5" (1.715 m)   Wt 140 lb 3.2 oz (63.6 kg)   SpO2 98%   BMI 21.63 kg/m   General Appearance: Well nourished, well groomed and in no apparent distress.  Eyes: PERRLA, EOMs, conjunctiva no swelling or erythema, normal fundi and vessels. Sinuses: No frontal/maxillary tenderness ENT/Mouth: EACs patent / TMs  nl. Nares clear without erythema, swelling, mucoid exudates. Oral hygiene is good. No erythema, swelling, or exudate. Tongue normal, non-obstructing. Tonsils not swollen or erythematous. Hearing normal.  Neck: Supple, thyroid not palpable. No bruits, nodes or JVD. Respiratory:  Respiratory effort normal.  BS equal and clear bilateral without rales, rhonci, wheezing or stridor. Cardio: Heart sounds are normal with regular rate and rhythm and no murmurs, rubs or gallops. Peripheral pulses are normal and equal bilaterally without edema. No aortic or femoral bruits. Chest: symmetric with normal excursions and percussion. Breasts: Symmetric, without lumps, nipple discharge, retractions, or fibrocystic changes.  Abdomen: Flat, soft with bowel sounds active. Nontender, no guarding, rebound, hernias, masses, or organomegaly.  Lymphatics: Non tender without lymphadenopathy.  Musculoskeletal: Full ROM all peripheral extremities, joint stability, 5/5 strength, and normal gait. Skin: Warm and dry without rashes, lesions, cyanosis, clubbing or  ecchymosis.  Neuro: Cranial nerves intact, reflexes equal bilaterally. Normal muscle tone, no cerebellar symptoms. Sensation intact.  Pysch: Alert and oriented X 3, normal affect, Insight and Judgment appropriate.   Assessment and Plan  1. Annual Preventative Screening Examination   2. Labile hypertension  - EKG 12-Lead - Urinalysis, Routine w reflex microscopic - Microalbumin / creatinine urine ratio - CBC with Differential/Platelet - COMPLETE METABOLIC PANEL WITH GFR - Magnesium  3. Hyperlipidemia, mixed  - EKG 12-Lead - Lipid panel -  TSH  4. Abnormal glucose  - EKG 12-Lead - Hemoglobin A1c - Insulin, random  5. Vitamin D deficiency  - VITAMIN D 25 Hydroxy  6. Anxiety tension state  - CBC with Differential/Platelet - TSH  7. Irritable bowel syndrome  - TSH  8. Chronic tension-type headache   9. Screening for colorectal cancer  - POC Hemoccult Bld/Stl  10. Screening for ischemic heart disease  - EKG 12-Lead  11. FHx: heart disease  - EKG 12-Lead  12. Fatigue, unspecified type  - CBC with Differential/Platelet - COMPLETE METABOLIC PANEL WITH GFR - Magnesium  13. Medication management  - CBC  with Differential/Platelet - COMPLETE METABOLIC PANEL WITH GFR - Magnesium - Lipid panel - TSH - Hemoglobin A1c - Insulin, random - VITAMIN D 25 Hydroxy         Patient was counseled in prudent diet to achieve/maintain BMI less than 25 for weight control, BP monitoring, regular exercise and medications. Discussed med's effects and SE's. Screening labs and tests as requested with regular follow-up as recommended. Over 40 minutes of exam, counseling, chart review and high complex critical decision making was performed.   Marinus Maw, MD

## 2020-08-01 NOTE — Patient Instructions (Signed)

## 2020-08-02 ENCOUNTER — Other Ambulatory Visit: Payer: Self-pay

## 2020-08-02 ENCOUNTER — Ambulatory Visit (INDEPENDENT_AMBULATORY_CARE_PROVIDER_SITE_OTHER): Payer: BC Managed Care – PPO | Admitting: Internal Medicine

## 2020-08-02 VITALS — BP 120/70 | HR 68 | Temp 97.8°F | Resp 16 | Ht 67.5 in | Wt 140.2 lb

## 2020-08-02 DIAGNOSIS — R0989 Other specified symptoms and signs involving the circulatory and respiratory systems: Secondary | ICD-10-CM | POA: Diagnosis not present

## 2020-08-02 DIAGNOSIS — Z131 Encounter for screening for diabetes mellitus: Secondary | ICD-10-CM

## 2020-08-02 DIAGNOSIS — E559 Vitamin D deficiency, unspecified: Secondary | ICD-10-CM

## 2020-08-02 DIAGNOSIS — Z1329 Encounter for screening for other suspected endocrine disorder: Secondary | ICD-10-CM | POA: Diagnosis not present

## 2020-08-02 DIAGNOSIS — Z0000 Encounter for general adult medical examination without abnormal findings: Secondary | ICD-10-CM

## 2020-08-02 DIAGNOSIS — Z13220 Encounter for screening for lipoid disorders: Secondary | ICD-10-CM | POA: Diagnosis not present

## 2020-08-02 DIAGNOSIS — G44229 Chronic tension-type headache, not intractable: Secondary | ICD-10-CM

## 2020-08-02 DIAGNOSIS — Z79899 Other long term (current) drug therapy: Secondary | ICD-10-CM

## 2020-08-02 DIAGNOSIS — M2550 Pain in unspecified joint: Secondary | ICD-10-CM

## 2020-08-02 DIAGNOSIS — R7309 Other abnormal glucose: Secondary | ICD-10-CM

## 2020-08-02 DIAGNOSIS — Z8249 Family history of ischemic heart disease and other diseases of the circulatory system: Secondary | ICD-10-CM

## 2020-08-02 DIAGNOSIS — Z0001 Encounter for general adult medical examination with abnormal findings: Secondary | ICD-10-CM

## 2020-08-02 DIAGNOSIS — K589 Irritable bowel syndrome without diarrhea: Secondary | ICD-10-CM

## 2020-08-02 DIAGNOSIS — Z1211 Encounter for screening for malignant neoplasm of colon: Secondary | ICD-10-CM

## 2020-08-02 DIAGNOSIS — Z136 Encounter for screening for cardiovascular disorders: Secondary | ICD-10-CM

## 2020-08-02 DIAGNOSIS — F419 Anxiety disorder, unspecified: Secondary | ICD-10-CM

## 2020-08-02 DIAGNOSIS — Z1389 Encounter for screening for other disorder: Secondary | ICD-10-CM | POA: Diagnosis not present

## 2020-08-02 DIAGNOSIS — E782 Mixed hyperlipidemia: Secondary | ICD-10-CM

## 2020-08-02 DIAGNOSIS — R5383 Other fatigue: Secondary | ICD-10-CM

## 2020-08-02 MED ORDER — MELOXICAM 15 MG PO TABS: ORAL_TABLET | ORAL | 3 refills | Status: DC

## 2020-08-03 LAB — CBC WITH DIFFERENTIAL/PLATELET
Absolute Monocytes: 434 cells/uL (ref 200–950)
Basophils Absolute: 71 cells/uL (ref 0–200)
Basophils Relative: 1.4 %
Eosinophils Absolute: 189 cells/uL (ref 15–500)
Eosinophils Relative: 3.7 %
HCT: 42.7 % (ref 35.0–45.0)
Hemoglobin: 14.1 g/dL (ref 11.7–15.5)
Lymphs Abs: 1418 cells/uL (ref 850–3900)
MCH: 29.4 pg (ref 27.0–33.0)
MCHC: 33 g/dL (ref 32.0–36.0)
MCV: 89 fL (ref 80.0–100.0)
MPV: 10.9 fL (ref 7.5–12.5)
Monocytes Relative: 8.5 %
Neutro Abs: 2989 cells/uL (ref 1500–7800)
Neutrophils Relative %: 58.6 %
Platelets: 281 10*3/uL (ref 140–400)
RBC: 4.8 10*6/uL (ref 3.80–5.10)
RDW: 11.9 % (ref 11.0–15.0)
Total Lymphocyte: 27.8 %
WBC: 5.1 10*3/uL (ref 3.8–10.8)

## 2020-08-03 LAB — COMPLETE METABOLIC PANEL WITH GFR
AG Ratio: 2 (calc) (ref 1.0–2.5)
ALT: 19 U/L (ref 6–29)
AST: 21 U/L (ref 10–35)
Albumin: 4.5 g/dL (ref 3.6–5.1)
Alkaline phosphatase (APISO): 41 U/L (ref 37–153)
BUN: 16 mg/dL (ref 7–25)
CO2: 31 mmol/L (ref 20–32)
Calcium: 9.8 mg/dL (ref 8.6–10.4)
Chloride: 101 mmol/L (ref 98–110)
Creat: 0.9 mg/dL (ref 0.50–0.99)
GFR, Est African American: 78 mL/min/{1.73_m2} (ref >=60)
GFR, Est Non African American: 67 mL/min/{1.73_m2} (ref >=60)
Globulin: 2.3 g/dL (calc) (ref 1.9–3.7)
Glucose, Bld: 88 mg/dL (ref 65–99)
Potassium: 5 mmol/L (ref 3.5–5.3)
Sodium: 140 mmol/L (ref 135–146)
Total Bilirubin: 0.3 mg/dL (ref 0.2–1.2)
Total Protein: 6.8 g/dL (ref 6.1–8.1)

## 2020-08-03 LAB — VITAMIN D 25 HYDROXY (VIT D DEFICIENCY, FRACTURES): Vit D, 25-Hydroxy: 83 ng/mL (ref 30–100)

## 2020-08-03 LAB — URINALYSIS, ROUTINE W REFLEX MICROSCOPIC
Bilirubin Urine: NEGATIVE
Glucose, UA: NEGATIVE
Hgb urine dipstick: NEGATIVE
Ketones, ur: NEGATIVE
Leukocytes,Ua: NEGATIVE
Nitrite: NEGATIVE
Protein, ur: NEGATIVE
Specific Gravity, Urine: 1.006 (ref 1.001–1.03)
pH: 7.5 (ref 5.0–8.0)

## 2020-08-03 LAB — INSULIN, RANDOM: Insulin: 2.4 u[IU]/mL

## 2020-08-03 LAB — MICROALBUMIN / CREATININE URINE RATIO
Creatinine, Urine: 15 mg/dL — ABNORMAL LOW (ref 20–275)
Microalb, Ur: <0.2 mg/dL

## 2020-08-03 LAB — MAGNESIUM: Magnesium: 2.5 mg/dL (ref 1.5–2.5)

## 2020-08-03 LAB — HEMOGLOBIN A1C
Hgb A1c MFr Bld: 5.3 % of total Hgb (ref <5.7)
Mean Plasma Glucose: 105 mg/dL
eAG (mmol/L): 5.8 mmol/L

## 2020-08-03 LAB — LIPID PANEL
Cholesterol: 209 mg/dL — ABNORMAL HIGH (ref <200)
HDL: 77 mg/dL (ref >=50)
LDL Cholesterol (Calc): 113 mg/dL (calc) — ABNORMAL HIGH
Non-HDL Cholesterol (Calc): 132 mg/dL (calc) — ABNORMAL HIGH (ref <130)
Total CHOL/HDL Ratio: 2.7 (calc) (ref <5.0)
Triglycerides: 88 mg/dL (ref <150)

## 2020-08-03 LAB — TSH: TSH: 1.44 mIU/L (ref 0.40–4.50)

## 2020-08-04 NOTE — Progress Notes (Signed)
============================================================ -   Test results slightly outside the reference range are not unusual. If there is anything important, I will review this with you,  otherwise it is considered normal test values.  If you have further questions,  please do not hesitate to contact me at the office or via My Chart.  ============================================================ ============================================================  -  Total Chol worse - gone up from 195 to Now 209             ( Ideal or Goal is less than 180  !  )   - and   - Bad /Dangerous LDL Chol also worse                                           - has gone up from 98 to 113  and              (  Ideal or Goal is less than 70  !  )   - So  -  Need to work  harder with low Chol diet     - Cholesterol    ONLY   comes from animal sources  - ie. meat, dairy, egg yolks                               (  "Egg Beaters" is OK   )   - Eat all the vegetables you want.  - Avoid meat, especially red meat      - Beef    AND    Pork .  - Avoid cheese & dairy - milk & ice cream.     - Cheese is the most concentrated form of trans-fats which  is the worst thing to clog up our arteries.   - Veggie cheese is OK which can be found in the fresh  produce section at [employer] or [employer] or [employer] ============================================================ ============================================================  -  A1c - Normal - Great - No Diabetes  ! ============================================================ ============================================================  -  Vitamin D = 83 - Excellent  ============================================================ ============================================================  -  All Else - CBC - Kidneys - Electrolytes - Liver & Magnesium - all  Normal  /OK ============================================================ ============================================================

## 2020-09-03 ENCOUNTER — Other Ambulatory Visit: Payer: Self-pay

## 2020-09-03 DIAGNOSIS — F419 Anxiety disorder, unspecified: Secondary | ICD-10-CM

## 2020-09-03 MED ORDER — ALPRAZOLAM 0.5 MG PO TABS: ORAL_TABLET | ORAL | 0 refills | Status: DC

## 2020-10-01 ENCOUNTER — Other Ambulatory Visit: Payer: Self-pay | Admitting: Internal Medicine

## 2020-10-01 MED ORDER — TOPIRAMATE 50 MG PO TABS: ORAL_TABLET | ORAL | 1 refills | Status: DC

## 2020-10-05 ENCOUNTER — Other Ambulatory Visit: Payer: Self-pay | Admitting: Internal Medicine

## 2020-10-05 MED ORDER — ATENOLOL 50 MG PO TABS: ORAL_TABLET | ORAL | 0 refills | Status: DC

## 2020-10-30 ENCOUNTER — Ambulatory Visit: Admit: 2020-10-30 | Payer: BC Managed Care – PPO

## 2020-11-04 ENCOUNTER — Other Ambulatory Visit: Payer: Self-pay | Admitting: Internal Medicine

## 2020-11-06 DIAGNOSIS — Z1231 Encounter for screening mammogram for malignant neoplasm of breast: Secondary | ICD-10-CM | POA: Diagnosis not present

## 2020-11-06 DIAGNOSIS — Z01419 Encounter for gynecological examination (general) (routine) without abnormal findings: Secondary | ICD-10-CM | POA: Diagnosis not present

## 2020-11-06 DIAGNOSIS — Z6821 Body mass index (BMI) 21.0-21.9, adult: Secondary | ICD-10-CM | POA: Diagnosis not present

## 2020-11-13 DIAGNOSIS — L821 Other seborrheic keratosis: Secondary | ICD-10-CM | POA: Diagnosis not present

## 2020-11-21 DIAGNOSIS — M7711 Lateral epicondylitis, right elbow: Secondary | ICD-10-CM | POA: Diagnosis not present

## 2020-11-21 DIAGNOSIS — M7712 Lateral epicondylitis, left elbow: Secondary | ICD-10-CM | POA: Diagnosis not present

## 2020-11-21 DIAGNOSIS — M65312 Trigger thumb, left thumb: Secondary | ICD-10-CM | POA: Diagnosis not present

## 2020-11-21 DIAGNOSIS — M25531 Pain in right wrist: Secondary | ICD-10-CM | POA: Diagnosis not present

## 2020-12-23 ENCOUNTER — Other Ambulatory Visit: Payer: Self-pay | Admitting: Internal Medicine

## 2020-12-23 DIAGNOSIS — F419 Anxiety disorder, unspecified: Secondary | ICD-10-CM

## 2021-01-02 DIAGNOSIS — Z13820 Encounter for screening for osteoporosis: Secondary | ICD-10-CM | POA: Diagnosis not present

## 2021-01-02 DIAGNOSIS — N951 Menopausal and female climacteric states: Secondary | ICD-10-CM | POA: Diagnosis not present

## 2021-01-02 DIAGNOSIS — M189 Osteoarthritis of first carpometacarpal joint, unspecified: Secondary | ICD-10-CM | POA: Diagnosis not present

## 2021-01-30 ENCOUNTER — Other Ambulatory Visit: Payer: Self-pay

## 2021-01-30 DIAGNOSIS — Z1211 Encounter for screening for malignant neoplasm of colon: Secondary | ICD-10-CM

## 2021-01-30 LAB — POC HEMOCCULT BLD/STL (HOME/3-CARD/SCREEN)
Card #2 Fecal Occult Blod, POC: NEGATIVE
Card #3 Fecal Occult Blood, POC: NEGATIVE
Fecal Occult Blood, POC: NEGATIVE

## 2021-02-19 ENCOUNTER — Ambulatory Visit: Payer: BC Managed Care – PPO | Admitting: Adult Health

## 2021-07-15 ENCOUNTER — Other Ambulatory Visit: Payer: Self-pay | Admitting: Internal Medicine

## 2021-07-15 DIAGNOSIS — F419 Anxiety disorder, unspecified: Secondary | ICD-10-CM

## 2021-07-17 ENCOUNTER — Other Ambulatory Visit: Payer: Self-pay | Admitting: Internal Medicine

## 2021-07-17 DIAGNOSIS — F419 Anxiety disorder, unspecified: Secondary | ICD-10-CM

## 2021-07-17 MED ORDER — ALPRAZOLAM 0.5 MG PO TABS: ORAL_TABLET | ORAL | 0 refills | Status: DC

## 2021-07-31 DIAGNOSIS — H02884 Meibomian gland dysfunction left upper eyelid: Secondary | ICD-10-CM | POA: Diagnosis not present

## 2021-07-31 DIAGNOSIS — H2513 Age-related nuclear cataract, bilateral: Secondary | ICD-10-CM | POA: Diagnosis not present

## 2021-07-31 DIAGNOSIS — H5203 Hypermetropia, bilateral: Secondary | ICD-10-CM | POA: Diagnosis not present

## 2021-07-31 DIAGNOSIS — H02882 Meibomian gland dysfunction right lower eyelid: Secondary | ICD-10-CM | POA: Diagnosis not present

## 2021-08-05 ENCOUNTER — Encounter: Payer: Self-pay | Admitting: Internal Medicine

## 2021-08-05 NOTE — Patient Instructions (Addendum)
Due to recent changes in healthcare laws, you may see the results of your imaging and laboratory studies on MyChart before your provider has had a chance to review them.  We understand that in some cases there may be results that are confusing or concerning to you. Not all laboratory results come back in the same time frame and the provider may be waiting for multiple results in order to interpret others.  Please give us 48 hours in order for your provider to thoroughly review all the results before contacting the office for clarification of your results.  ? ?+++++++++++++++++++++++++ ? Vit D  & ?Vit C 1,000 mg   ?are recommended to help protect  ?against the Covid-19 and other Corona viruses.  ? ? Also it's recommended  ?to take  ?Zinc 50 mg  ?to help  ?protect against the Covid-19   ?and best place to get ? is also on [employer]  ?and don't pay more than 6-8 cents /pill !  ?================================ ?Coronavirus (COVID-19) Are you at risk? ? ?Are you at risk for the Coronavirus (COVID-19)? ? ?To be considered HIGH RISK for Coronavirus (COVID-19), you have to meet the following criteria: ? ?Traveled to [place], [place], [place], [place] or [place]; or in the [place] to [place], [place], [place]  ?or [place]; and have fever, cough, and shortness of breath within the last 2 weeks of travel OR ?Been in close contact with a person diagnosed with COVID-19 within the last 2 weeks and have  ?fever, cough,and shortness of breath ? ?IF YOU DO NOT MEET THESE CRITERIA, YOU ARE CONSIDERED LOW RISK FOR COVID-19. ? ?What to do if you are HIGH RISK for COVID-19? ? ?If you are having a medical emergency, call 911. ?Seek medical care right away. Before you go to a doctor?s office, urgent care or emergency department, ? call ahead and tell them about your recent travel, contact with someone diagnosed with COVID-19  ? and your symptoms.  ?You should receive instructions from your physician?s office regarding next  steps of care.  ?When you arrive at healthcare provider, tell the healthcare staff immediately you have returned from  ?visiting [place], [place], [place], [place] or [place]; or traveled in the [place] to [place], [place],  ?[place] or [place] in the last two weeks or you have been in close contact with a person diagnosed with  ?COVID-19 in the last 2 weeks.   ?Tell the health care staff about your symptoms: fever, cough and shortness of breath. ?After you have been seen by a medical provider, you will be either: ?Tested for (COVID-19) and discharged home on quarantine except to seek medical care if  ?symptoms worsen, and asked to  ?Stay home and avoid contact with others until you get your results (4-5 days)  ?Avoid travel on public transportation if possible (such as bus, train, or airplane) or ?Sent to the Emergency Department by EMS for evaluation, COVID-19 testing  and  ?possible admission depending on your condition and test results. ? ?What to do if you are LOW RISK for COVID-19? ? ?Reduce your risk of any infection by using the same precautions used for avoiding the common cold or flu:  ?Wash your hands often with soap and warm water for at least 20 seconds.  If soap and water are not readily available,  ?use an alcohol-based hand sanitizer with at least 60% alcohol.  ?If coughing or sneezing, cover your mouth and nose by coughing   or sneezing into the elbow areas of your shirt or coat, ? into a tissue or into your sleeve (not your hands). ?Avoid shaking hands with others and consider head nods or verbal greetings only. ?Avoid touching your eyes, nose, or mouth with unwashed hands.  ?Avoid close contact with people who are sick. ?Avoid places or events with large numbers of people in one location, like concerts or sporting events. ?Carefully consider travel plans you have or are making. ?If you are planning any travel outside or inside the [place], visit the CDC?s Travelers? Health webpage for the  latest health notices. ?If you have some symptoms but not all symptoms, continue to monitor at home and seek medical attention  ?if your symptoms worsen. ?If you are having a medical emergency, call 911. ? ? ?>>>>>>>>>>>>>>>>>>>>>>>>>>>>>>>>>>>>>>>> ? ?We Do NOT Approve of [employer] ?> > > > > > > > > > > > > > > > > > > > > > > > > > > > > >  ? ?Preventive Care for Adults ? ?A healthy lifestyle and preventive care can promote health and wellness. Preventive health guidelines for women include the following key practices. ?A routine yearly physical is a good way to check with your health care provider about your health and preventive screening. It is a chance to share any concerns and updates on your health and to receive a thorough exam. ?Visit your dentist for a routine exam and preventive care every 6 months. Brush your teeth twice a day and floss once a day. Good oral hygiene prevents tooth decay and gum disease. ?The frequency of eye exams is based on your age, health, family medical history, use of contact lenses, and other factors. Follow your health care provider's recommendations for frequency of eye exams. ?Eat a healthy diet. Foods like vegetables, fruits, whole grains, low-fat dairy products, and lean protein foods contain the nutrients you need without too many calories. Decrease your intake of foods high in solid fats, added sugars, and salt. Eat the right amount of calories for you. Get information about a proper diet from your health care provider, if necessary. ?Regular physical exercise is one of the most important things you can do for your health. Most adults should get at least 150 minutes of moderate-intensity exercise (any activity that increases your heart rate and causes you to sweat) each week. In addition, most adults need muscle-strengthening exercises on 2 or more days a week. ?Maintain a healthy weight. The body mass index (BMI) is a screening tool to identify possible weight  problems. It provides an estimate of body fat based on height and weight. Your health care provider can find your BMI and can help you achieve or maintain a healthy weight. For adults 20 years and older: ?A BMI below 18.5 is considered underweight. ?A BMI of 18.5 to 24.9 is normal. ?A BMI of 25 to 29.9 is considered overweight. ?A BMI of 30 and above is considered obese. ?Maintain normal blood lipids and cholesterol levels by exercising and minimizing your intake of saturated fat. Eat a balanced diet with plenty of fruit and vegetables. If your lipid or cholesterol levels are high, you are over 50, or you are at high risk for heart disease, you may need your cholesterol levels checked more frequently. Ongoing high lipid and cholesterol levels should be treated with medicines if diet and exercise are not working. ?If you smoke, find out from your health care provider how to quit. If  you do not use tobacco, do not start. ?Lung cancer screening is recommended for adults aged 55-80 years who are at high risk for developing lung cancer because of a history of smoking. A yearly low-dose CT scan of the lungs is recommended for people who have at least a 30-pack-year history of smoking and are a current smoker or have quit within the past 15 years. A pack year of smoking is smoking an average of 1 pack of cigarettes a day for 1 year (for example: 1 pack a day for 30 years or 2 packs a day for 15 years). Yearly screening should continue until the smoker has stopped smoking for at least 15 years. Yearly screening should be stopped for people who develop a health problem that would prevent them from having lung cancer treatment. ?Avoid use of street drugs. Do not share needles with anyone. Ask for help if you need support or instructions about stopping the use of drugs. ?High blood pressure causes heart disease and increases the risk of stroke.  Ongoing high blood pressure should be treated with medicines if weight loss and  exercise do not work. ?If you are 60-36 years old, ask your health care provider if you should take aspirin to prevent strokes. ?Diabetes screening involves taking a blood sample to check your fasting blood

## 2021-08-05 NOTE — Progress Notes (Signed)
? ?Annual Screening/Preventative Visit ?& Comprehensive Evaluation &  Examination ? ?Future Appointments  ?Date Time Provider Department  ?08/06/2021 10:00 AM Lucky CowboyMcKeown, Aloysious Vangieson, MD [facility]  ?08/07/2022  2:00 PM Lucky CowboyMcKeown, Necha Harries, MD [facility]  ? ? ?    This very nice 66 y.o. MWF presents for a Screening /Preventative Visit & comprehensive evaluation and management of multiple medical co-morbidities.  Patient has been followed expectantly /screened for elevated BP, abnormal lipids, Prediabetes  and Vitamin D Deficiency. ? ? ?     Labile HTN predates since  2000. Patient's BP has been controlled at home and patient denies any cardiac symptoms as chest pain, palpitations, shortness of breath, dizziness or ankle swelling. Today's BP is at goal - 118/76.  ? ? ?    Patient's hyperlipidemia is not controlled with diet . Last lipids were not at goal : ? ?Lab Results  ?Component Value Date  ? CHOL 209 (H) 08/02/2020  ? HDL 77 08/02/2020  ? LDLCALC 113 (H) 08/02/2020  ? TRIG 88 08/02/2020  ? CHOLHDL 2.7 08/02/2020  ? ? ? ?    Patient is monitored expectantly for glucose intolerance and patient denies reactive hypoglycemic symptoms, visual blurring, diabetic polys or paresthesias. Last A1c was normal & at goal: ? ?Lab Results  ?Component Value Date  ? HGBA1C 5.3 08/02/2020  ? ? ? ?    Finally, patient has history of Vitamin D Deficiency and last Vitamin D was at goal : ? ?Lab Results  ?Component Value Date  ? VD25OH 83 08/02/2020  ? ? ? ?Current Outpatient Medications on File Prior to Visit  ?Medication Sig  ? ALPRAZolam 0.5 MG tablet Take  1/2 - 1 tab 2 - 3 x /day  ONLY  if needed for Anxiety Attack   ? atenolol 50 MG tablet TAKE 1/2 TO 1 TABLET EVERY MORNING   ? buPROPion XL 300 MG  Take  daily.  ? FIORICET TAKE 1 TABLET  EVERY 4 HRS AS NEEDED   ? VITAMIN D  5,000 Units  Take daily.  ? meloxicam 15 MG tablet Take 1/2 to 1 tablet Daily   ? MINIVELLE 0.1 MG/24HR patch   ? topiramate 50 MG tablet Take 1/2 to 1 tablet 2 x /day    ? ? ? ? ?Allergies  ?Allergen Reactions  ? Ciprofloxacin   ?  headaches  ? Luvox [Fluvoxamine] Nausea And Vomiting  ? Sulfa Antibiotics Rash  ? ? ? ?Past Medical History:  ?Diagnosis Date  ? Chronic tension headaches   ? Depression   ? IBS (irritable bowel syndrome)   ? Labile hypertension   ? Vitamin D deficiency   ? ? ? ?Health Maintenance  ?Topic Date Due  ? Zoster Vaccines- Shingrix (1 of 2) Never done  ? MAMMOGRAM  08/11/2009  ? COVID-19 Vaccine (3 - Moderna risk series) 08/17/2019  ? Pneumonia Vaccine 265+ Years old (1 - PCV) 12/12/2019  ? DEXA SCAN  Never done  ? INFLUENZA VACCINE  11/13/2021  ? COLONOSCOPY (Pts [id number] Insurance coverage will need to be confirmed)  08/02/2026  ? TETANUS/TDAP  09/01/2028  ? Hepatitis C Screening  Completed  ? HPV VACCINES  Aged Out  ? ? ? ?Immunization History  ?Administered Date(s) Administered  ? Influenza Split 02/02/2014, 02/13/2015  ? Influenza,inj,Quad 01/12/2019  ? Influenza,inj,quad 03/19/2016  ? Moderna Sars-Covid-2 Vacc 06/22/2019, 07/20/2019  ? PPD Test 05/23/2017, 06/24/2018, 07/26/2019  ? Pneumococcal-23 01/27/2012  ? Tdap 01/10/2010, 09/02/2018  ? ? ?Last Colon - 07/25/2016 -  Dr Joya San - recommended 10 yr f/u in Apr 2028 ? ? ?Last MGM - 10/12/19 - Dr Jennette Kettle  ? ? ?Past Surgical History:  ?Procedure Laterality Date  ? ABDOMINAL HYSTERECTOMY    ? total hysterectomy  ? BILATERAL TEMPOROMANDIBULAR JOINT ARTHROPLASTY  1986, 1988  ? HEMICOLECTOMY  1991  ? TUBAL LIGATION  1981  ? ? ? ?Family History  ?Problem Relation Age of Onset  ? Cancer Mother   ?     lung  ? Cancer Father   ?     colon  ? Alzheimer's disease Father   ? ? ? ?Social History  ? ?Tobacco Use  ? Smoking status: Never  ? Smokeless tobacco: Never  ?Vaping Use  ? Vaping Use: Never used  ?Substance Use Topics  ? Alcohol use: Yes  ?  Comment: Social  ? Drug use: No  ? ? ? ? ROS ?Constitutional: Denies fever, chills, weight loss/gain, headaches, insomnia,  night sweats, and change in appetite. Does c/o  fatigue. ?Eyes: Denies redness, blurred vision, diplopia, discharge, itchy, watery eyes.  ?ENT: Denies discharge, congestion, post nasal drip, epistaxis, sore throat, earache, hearing loss, dental pain, Tinnitus, Vertigo, Sinus pain, snoring.  ?Cardio: Denies chest pain, palpitations, irregular heartbeat, syncope, dyspnea, diaphoresis, orthopnea, PND, claudication, edema ?Respiratory: denies cough, dyspnea, DOE, pleurisy, hoarseness, laryngitis, wheezing.  ?Gastrointestinal: Denies dysphagia, heartburn, reflux, water brash, pain, cramps, nausea, vomiting, bloating, diarrhea, constipation, hematemesis, melena, hematochezia, jaundice, hemorrhoids ?Genitourinary: Denies dysuria, frequency, urgency, nocturia, hesitancy, discharge, hematuria, flank pain ?Breast: Breast lumps, nipple discharge, bleeding.  ?Musculoskeletal: Denies arthralgia, myalgia, stiffness, Jt. Swelling, pain, limp, and strain/sprain. Denies falls. ?Skin: Denies puritis, rash, hives, warts, acne, eczema, changing in skin lesion ?Neuro: No weakness, tremor, incoordination, spasms, paresthesia, pain ?Psychiatric: Denies confusion, memory loss, sensory loss. Denies Depression. ?Endocrine: Denies change in weight, skin, hair change, nocturia, and paresthesia, diabetic polys, visual blurring, hyper / hypo glycemic episodes.  ?Heme/Lymph: No excessive bleeding, bruising, enlarged lymph nodes. ? ?Physical Exam ? ?BP 118/76   Pulse 86   Temp 97.9 ?F (36.6 ?C)   Resp 16   Ht 5' 7.5" (1.715 m)   Wt 146 lb 6.4 oz (66.4 kg)   SpO2 97%   BMI 22.59 kg/m?  ? ?General Appearance: Well nourished, well groomed and in no apparent distress. ? ?Eyes: PERRLA, EOMs, conjunctiva no swelling or erythema, normal fundi and vessels. ?Sinuses: No frontal/maxillary tenderness ?ENT/Mouth: EACs patent / TMs  nl. Nares clear without erythema, swelling, mucoid exudates. Oral hygiene is good. No erythema, swelling, or exudate. Tongue normal, non-obstructing. Tonsils not swollen  or erythematous. Hearing normal.  ?Neck: Supple, thyroid not palpable. No bruits, nodes or JVD. ?Respiratory: Respiratory effort normal.  BS equal and clear bilateral without rales, rhonci, wheezing or stridor. ?Cardio: Heart sounds are normal with regular rate and rhythm and no murmurs, rubs or gallops. Peripheral pulses are normal and equal bilaterally without edema. No aortic or femoral bruits. ?Chest: symmetric with normal excursions and percussion. ?Breasts: Symmetric, without lumps, nipple discharge, retractions, or fibrocystic changes.  ?Abdomen: Flat, soft with bowel sounds active. Nontender, no guarding, rebound, hernias, masses, or organomegaly.  ?Lymphatics: Non tender without lymphadenopathy.  ?Genitourinary:  ?Musculoskeletal: Full ROM all peripheral extremities, joint stability, 5/5 strength, and normal gait. ?Skin: Warm and dry without rashes, lesions, cyanosis, clubbing or  ecchymosis.  ?Neuro: Cranial nerves intact, reflexes equal bilaterally. Normal muscle tone, no cerebellar symptoms. Sensation intact.  ?Pysch: Alert and oriented X 3, normal affect, Insight and Judgment  appropriate.  ? ? ?Assessment and Plan ? ?1. Annual Preventative Screening Examination ? ? ?2. Labile hypertension ? ?- EKG 12-Lead ?- Urinalysis, Routine w reflex microscopic ?- Microalbumin / creatinine urine ratio ?- CBC with Differential/Platelet ?- COMPLETE METABOLIC PANEL WITH GFR ?- Magnesium ?- TSH ? ?3. Hyperlipidemia, mixed ? ?- EKG 12-Lead ?- Lipid panel ?- TSH ? ?4. Abnormal glucose ? ?- EKG 12-Lead ?- Hemoglobin A1c ?- Insulin, random ? ?5. Vitamin D deficiency ? ?- VITAMIN D 25 Hydroxy  ? ?6. Screening for colorectal cancer ? ?- POC Hemoccult Bld/Stl  ? ?7. Screening for ischemic heart disease ? ?- EKG 12-Lead ? ?8. FHx: heart disease ? ?- EKG 12-Lead ? ?9. Fatigue ? ? ?10. Medication management ? ?- Urinalysis, Routine w reflex microscopic ?- Microalbumin / creatinine urine ratio ?- CBC with Differential/Platelet ?-  COMPLETE METABOLIC PANEL WITH GFR ?- Magnesium ?- Lipid panel ?- TSH ?- Hemoglobin A1c ?- Insulin, random ?- VITAMIN D 25 Hydroxy  ? ? ?       Patient was counseled in prudent diet to achieve/maintain BMI less tha

## 2021-08-06 ENCOUNTER — Encounter: Payer: Self-pay | Admitting: Internal Medicine

## 2021-08-06 ENCOUNTER — Ambulatory Visit (INDEPENDENT_AMBULATORY_CARE_PROVIDER_SITE_OTHER): Payer: Medicare Other | Admitting: Internal Medicine

## 2021-08-06 VITALS — BP 118/76 | HR 86 | Temp 97.9°F | Resp 16 | Ht 67.5 in | Wt 146.4 lb

## 2021-08-06 DIAGNOSIS — R0989 Other specified symptoms and signs involving the circulatory and respiratory systems: Secondary | ICD-10-CM

## 2021-08-06 DIAGNOSIS — Z0000 Encounter for general adult medical examination without abnormal findings: Secondary | ICD-10-CM | POA: Diagnosis not present

## 2021-08-06 DIAGNOSIS — Z79899 Other long term (current) drug therapy: Secondary | ICD-10-CM | POA: Diagnosis not present

## 2021-08-06 DIAGNOSIS — Z136 Encounter for screening for cardiovascular disorders: Secondary | ICD-10-CM | POA: Diagnosis not present

## 2021-08-06 DIAGNOSIS — R7309 Other abnormal glucose: Secondary | ICD-10-CM | POA: Diagnosis not present

## 2021-08-06 DIAGNOSIS — R5383 Other fatigue: Secondary | ICD-10-CM

## 2021-08-06 DIAGNOSIS — Z0001 Encounter for general adult medical examination with abnormal findings: Secondary | ICD-10-CM

## 2021-08-06 DIAGNOSIS — E782 Mixed hyperlipidemia: Secondary | ICD-10-CM

## 2021-08-06 DIAGNOSIS — E559 Vitamin D deficiency, unspecified: Secondary | ICD-10-CM

## 2021-08-06 DIAGNOSIS — Z8249 Family history of ischemic heart disease and other diseases of the circulatory system: Secondary | ICD-10-CM | POA: Diagnosis not present

## 2021-08-06 DIAGNOSIS — Z1211 Encounter for screening for malignant neoplasm of colon: Secondary | ICD-10-CM

## 2021-08-06 MED ORDER — BUPROPION HCL ER (XL) 300 MG PO TB24: ORAL_TABLET | ORAL | 3 refills | Status: DC

## 2021-08-07 LAB — CBC WITH DIFFERENTIAL/PLATELET
Absolute Monocytes: 366 cells/uL (ref 200–950)
Basophils Absolute: 60 cells/uL (ref 0–200)
Basophils Relative: 1.4 %
Eosinophils Absolute: 172 cells/uL (ref 15–500)
Eosinophils Relative: 4 %
HCT: 38.9 % (ref 35.0–45.0)
Hemoglobin: 12.7 g/dL (ref 11.7–15.5)
Lymphs Abs: 1204 cells/uL (ref 850–3900)
MCH: 28.9 pg (ref 27.0–33.0)
MCHC: 32.6 g/dL (ref 32.0–36.0)
MCV: 88.6 fL (ref 80.0–100.0)
MPV: 11.2 fL (ref 7.5–12.5)
Monocytes Relative: 8.5 %
Neutro Abs: 2498 cells/uL (ref 1500–7800)
Neutrophils Relative %: 58.1 %
Platelets: 300 10*3/uL (ref 140–400)
RBC: 4.39 10*6/uL (ref 3.80–5.10)
RDW: 12.1 % (ref 11.0–15.0)
Total Lymphocyte: 28 %
WBC: 4.3 10*3/uL (ref 3.8–10.8)

## 2021-08-07 LAB — COMPLETE METABOLIC PANEL WITH GFR
AG Ratio: 1.8 (calc) (ref 1.0–2.5)
ALT: 12 U/L (ref 6–29)
AST: 19 U/L (ref 10–35)
Albumin: 4.4 g/dL (ref 3.6–5.1)
Alkaline phosphatase (APISO): 42 U/L (ref 37–153)
BUN: 18 mg/dL (ref 7–25)
CO2: 30 mmol/L (ref 20–32)
Calcium: 9.8 mg/dL (ref 8.6–10.4)
Chloride: 104 mmol/L (ref 98–110)
Creat: 0.86 mg/dL (ref 0.50–1.05)
Globulin: 2.4 g/dL (calc) (ref 1.9–3.7)
Glucose, Bld: 94 mg/dL (ref 65–99)
Potassium: 5.1 mmol/L (ref 3.5–5.3)
Sodium: 141 mmol/L (ref 135–146)
Total Bilirubin: 0.3 mg/dL (ref 0.2–1.2)
Total Protein: 6.8 g/dL (ref 6.1–8.1)
eGFR: 74 mL/min/{1.73_m2} (ref >=60)

## 2021-08-07 LAB — LIPID PANEL
Cholesterol: 197 mg/dL (ref <200)
HDL: 94 mg/dL (ref >=50)
LDL Cholesterol (Calc): 87 mg/dL (calc)
Non-HDL Cholesterol (Calc): 103 mg/dL (calc) (ref <130)
Total CHOL/HDL Ratio: 2.1 (calc) (ref <5.0)
Triglycerides: 70 mg/dL (ref <150)

## 2021-08-07 LAB — VITAMIN D 25 HYDROXY (VIT D DEFICIENCY, FRACTURES): Vit D, 25-Hydroxy: 72 ng/mL (ref 30–100)

## 2021-08-07 LAB — URINALYSIS, ROUTINE W REFLEX MICROSCOPIC
Bilirubin Urine: NEGATIVE
Glucose, UA: NEGATIVE
Hgb urine dipstick: NEGATIVE
Ketones, ur: NEGATIVE
Leukocytes,Ua: NEGATIVE
Nitrite: NEGATIVE
Protein, ur: NEGATIVE
Specific Gravity, Urine: 1.005 (ref 1.001–1.035)
pH: 7.5 (ref 5.0–8.0)

## 2021-08-07 LAB — MAGNESIUM: Magnesium: 2.5 mg/dL (ref 1.5–2.5)

## 2021-08-07 LAB — HEMOGLOBIN A1C
Hgb A1c MFr Bld: 5.3 % of total Hgb (ref <5.7)
Mean Plasma Glucose: 105 mg/dL
eAG (mmol/L): 5.8 mmol/L

## 2021-08-07 LAB — TSH: TSH: 1.98 mIU/L (ref 0.40–4.50)

## 2021-08-07 LAB — MICROALBUMIN / CREATININE URINE RATIO
Creatinine, Urine: 13 mg/dL — ABNORMAL LOW (ref 20–275)
Microalb, Ur: <0.2 mg/dL

## 2021-08-07 LAB — INSULIN, RANDOM: Insulin: 2.9 u[IU]/mL

## 2021-08-07 NOTE — Progress Notes (Signed)
<><><><><><><><><><><><><><><><><><><><><><><><><><><><><><><><><> ?<><><><><><><><><><><><><><><><><><><><><><><><><><><><><><><><><> ?-   Test results slightly outside the reference range are not unusual. ?If there is anything important, I will review this with you,  ?otherwise it is considered normal test values.  ?If you have further questions,  ?please do not hesitate to contact me at the office or via My Chart.  ?<><><><><><><><><><><><><><><><><><><><><><><><><><><><><><><><><> ?<><><><><><><><><><><><><><><><><><><><><><><><><><><><><><><><><> ? ?-   ?-  Total  Chol =       197     - Elevated  ?           (  Ideal  or  Goal is less than 180  !  )  ? ?- and  ? ?-  Bad / Dangerous LDL  Chol =    87    - also Elevated  ?            (  Ideal  or  Goal is less than 70  !  )  ? ? - Recommend a stricter  low cholesterol diet  ? ?- Cholesterol only comes from animal sources  ?- ie. meat, dairy, egg yolks ? ?- Eat all the vegetables you want. ? ?- Avoid meat, especially red meat - Beef AND Pork . ? ?- Avoid cheese & dairy - milk & ice cream.    ? ?- Cheese is the most concentrated form of trans-fats which  ?                                                                is the worst thing to clog up our arteries.  ? ?- Veggie cheese is OK which can be found in the ?                     fresh produce section at [employer] or [employer] or [employer] ?<><><><><><><><><><><><><><><><><><><><><><><><><><><><><><><><><> ? ?-  A1c - Normal - No Diabetes   - Great  ! ?<><><><><><><><><><><><><><><><><><><><><><><><><><><><><><><><><> ? ?-  Vitamin D = 72   - Excellent   - Please keep dose same  ?<><><><><><><><><><><><><><><><><><><><><><><><><><><><><><><><><> ? ?-  All Else - CBC - Kidneys - U/A -  Electrolytes - Liver - Magnesium & Thyroid   ? ?- all  Normal / OK ?<><><><><><><><><><><><><><><><><><><><><><><><><><><><><><><><><> ?<><><><><><><><><><><><><><><><><><><><><><><><><><><><><><><><><> ? ?-  Keep up the [place]  Work  ! ? ?<><><><><><><><><><><><><><><><><><><><><><><><><><><><><><><><><> ?<><><><><><><><><><><><><><><><><><><><><><><><><><><><><><><><><> ? ? ? ? ? ? ? ? ? ? ? ? ? ? ? ? ? ?

## 2021-08-29 DIAGNOSIS — L249 Irritant contact dermatitis, unspecified cause: Secondary | ICD-10-CM | POA: Diagnosis not present

## 2021-08-29 DIAGNOSIS — Z85828 Personal history of other malignant neoplasm of skin: Secondary | ICD-10-CM | POA: Diagnosis not present

## 2021-08-29 DIAGNOSIS — L853 Xerosis cutis: Secondary | ICD-10-CM | POA: Diagnosis not present

## 2021-08-29 DIAGNOSIS — Z872 Personal history of diseases of the skin and subcutaneous tissue: Secondary | ICD-10-CM | POA: Diagnosis not present

## 2021-08-29 DIAGNOSIS — L578 Other skin changes due to chronic exposure to nonionizing radiation: Secondary | ICD-10-CM | POA: Diagnosis not present

## 2021-11-28 DIAGNOSIS — Z6823 Body mass index (BMI) 23.0-23.9, adult: Secondary | ICD-10-CM | POA: Diagnosis not present

## 2021-11-28 DIAGNOSIS — Z1231 Encounter for screening mammogram for malignant neoplasm of breast: Secondary | ICD-10-CM | POA: Diagnosis not present

## 2021-12-12 ENCOUNTER — Other Ambulatory Visit: Payer: Self-pay | Admitting: Internal Medicine

## 2021-12-12 DIAGNOSIS — F419 Anxiety disorder, unspecified: Secondary | ICD-10-CM

## 2021-12-13 ENCOUNTER — Other Ambulatory Visit: Payer: Self-pay | Admitting: Internal Medicine

## 2021-12-13 DIAGNOSIS — F419 Anxiety disorder, unspecified: Secondary | ICD-10-CM

## 2021-12-14 MED ORDER — ALPRAZOLAM 0.5 MG PO TABS: ORAL_TABLET | ORAL | 0 refills | Status: DC

## 2022-01-22 ENCOUNTER — Other Ambulatory Visit: Payer: Self-pay | Admitting: Internal Medicine

## 2022-01-22 DIAGNOSIS — G44229 Chronic tension-type headache, not intractable: Secondary | ICD-10-CM

## 2022-01-23 ENCOUNTER — Other Ambulatory Visit: Payer: Self-pay | Admitting: Internal Medicine

## 2022-01-23 DIAGNOSIS — G44229 Chronic tension-type headache, not intractable: Secondary | ICD-10-CM

## 2022-01-24 MED ORDER — BUTALBITAL-APAP-CAFFEINE 50-325-40 MG PO TABS: ORAL_TABLET | ORAL | 0 refills | Status: DC

## 2022-02-06 ENCOUNTER — Ambulatory Visit: Payer: Medicare Other | Admitting: Nurse Practitioner

## 2022-02-11 ENCOUNTER — Ambulatory Visit: Payer: Medicare Other | Admitting: Nurse Practitioner

## 2022-03-29 ENCOUNTER — Other Ambulatory Visit: Payer: Self-pay

## 2022-03-29 DIAGNOSIS — Z1211 Encounter for screening for malignant neoplasm of colon: Secondary | ICD-10-CM

## 2022-03-29 LAB — POC HEMOCCULT BLD/STL (HOME/3-CARD/SCREEN)
Card #2 Fecal Occult Blod, POC: NEGATIVE
Card #3 Fecal Occult Blood, POC: NEGATIVE
Fecal Occult Blood, POC: NEGATIVE

## 2022-04-01 DIAGNOSIS — Z1211 Encounter for screening for malignant neoplasm of colon: Secondary | ICD-10-CM

## 2022-04-01 DIAGNOSIS — Z1212 Encounter for screening for malignant neoplasm of rectum: Secondary | ICD-10-CM

## 2022-04-13 ENCOUNTER — Telehealth: Payer: Medicare Other | Admitting: Physician Assistant

## 2022-04-13 DIAGNOSIS — J329 Chronic sinusitis, unspecified: Secondary | ICD-10-CM

## 2022-04-13 DIAGNOSIS — J069 Acute upper respiratory infection, unspecified: Secondary | ICD-10-CM

## 2022-04-13 DIAGNOSIS — B9789 Other viral agents as the cause of diseases classified elsewhere: Secondary | ICD-10-CM

## 2022-04-13 MED ORDER — BENZONATATE 100 MG PO CAPS: 100.0000 mg | ORAL_CAPSULE | Freq: Three times a day (TID) | ORAL | 0 refills | Status: AC | PRN

## 2022-04-13 NOTE — Progress Notes (Signed)
We are sorry that you are not feeling well.  Here is how we plan to help!  Based on your presentation I believe you most likely have A cough due to a virus.  This is called viral bronchitis and is best treated by rest, plenty of fluids and control of the cough.  You may use Ibuprofen or Tylenol as directed to help your symptoms.     In addition you may use A non-prescription cough medication called Robitussin DAC. Take 2 teaspoons every 8 hours or Delsym: take 2 teaspoons every 12 hours., A non-prescription cough medication called Mucinex DM: take 2 tablets every 12 hours., and A prescription cough medication called Tessalon Perles 100mg. You may take 1-2 capsules every 8 hours as needed for your cough.    From your responses in the eVisit questionnaire you describe inflammation in the upper respiratory tract which is causing a significant cough.  This is commonly called Bronchitis and has four common causes:   Allergies Viral Infections Acid Reflux Bacterial Infection Allergies, viruses and acid reflux are treated by controlling symptoms or eliminating the cause. An example might be a cough caused by taking certain blood pressure medications. You stop the cough by changing the medication. Another example might be a cough caused by acid reflux. Controlling the reflux helps control the cough.  USE OF BRONCHODILATOR ("RESCUE") INHALERS: There is a risk from using your bronchodilator too frequently.  The risk is that over-reliance on a medication which only relaxes the muscles surrounding the breathing tubes can reduce the effectiveness of medications prescribed to reduce swelling and congestion of the tubes themselves.  Although you feel brief relief from the bronchodilator inhaler, your asthma may actually be worsening with the tubes becoming more swollen and filled with mucus.  This can delay other crucial treatments, such as oral steroid medications. If you need to use a bronchodilator inhaler  daily, several times per day, you should discuss this with your provider.  There are probably better treatments that could be used to keep your asthma under control.     HOME CARE Only take medications as instructed by your medical team. Complete the entire course of an antibiotic. Drink plenty of fluids and get plenty of rest. Avoid close contacts especially the very young and the elderly Cover your mouth if you cough or cough into your sleeve. Always remember to wash your hands A steam or ultrasonic humidifier can help congestion.   GET HELP RIGHT AWAY IF: You develop worsening fever. You become short of breath You cough up blood. Your symptoms persist after you have completed your treatment plan MAKE SURE YOU  Understand these instructions. Will watch your condition. Will get help right away if you are not doing well or get worse.    Thank you for choosing an e-visit.  Your e-visit answers were reviewed by a board certified advanced clinical practitioner to complete your personal care plan. Depending upon the condition, your plan could have included both over the counter or prescription medications.  Please review your pharmacy choice. Make sure the pharmacy is open so you can pick up prescription now. If there is a problem, you may contact your provider through MyChart messaging and have the prescription routed to another pharmacy.  Your safety is important to us. If you have drug allergies check your prescription carefully.   For the next 24 hours you can use MyChart to ask questions about today's visit, request a non-urgent call back, or ask for a   work or school excuse. You will get an email in the next two days asking about your experience. I hope that your e-visit has been valuable and will speed your recovery.  I have spent 5 minutes in review of e-visit questionnaire, review and updating patient chart, medical decision making and response to patient.   Eraina Winnie Z Ward,  PA-C    

## 2022-04-14 MED ORDER — PREDNISONE 50 MG PO TABS: 50.0000 mg | ORAL_TABLET | Freq: Every day | ORAL | 0 refills | Status: AC

## 2022-04-14 MED ORDER — IPRATROPIUM BROMIDE 0.03 % NA SOLN: 2.0000 | Freq: Two times a day (BID) | NASAL | 12 refills | Status: DC

## 2022-04-14 NOTE — Progress Notes (Signed)
E-Visit for Sinus Problems  We are sorry that you are not feeling well.  Here is how we plan to help!  Based on what you have shared with me it looks like you have sinusitis.  Sinusitis is inflammation and infection in the sinus cavities of the head.  Based on your presentation I believe you most likely have Acute Viral Sinusitis.This is an infection most likely caused by a virus. There is not specific treatment for viral sinusitis other than to help you with the symptoms until the infection runs its course.  You may use an oral decongestant such as Mucinex D or if you have glaucoma or high blood pressure use plain Mucinex. Saline nasal spray help and can safely be used as often as needed for congestion, I have prescribed: Ipratropium Bromide nasal spray 0.03% 2 sprays in eah nostril 2-3 times a day. Also prednisone 50mg  tab to take once daily with breakfast.   Some authorities believe that zinc sprays or the use of Echinacea may shorten the course of your symptoms.  Sinus infections are not as easily transmitted as other respiratory infection, however we still recommend that you avoid close contact with loved ones, especially the very young and elderly.  Remember to wash your hands thoroughly throughout the day as this is the number one way to prevent the spread of infection!  Home Care: Only take medications as instructed by your medical team. Do not take these medications with alcohol. A steam or ultrasonic humidifier can help congestion.  You can place a towel over your head and breathe in the steam from hot water coming from a faucet. Avoid close contacts especially the very young and the elderly. Cover your mouth when you cough or sneeze. Always remember to wash your hands.  Get Help Right Away If: You develop worsening fever or sinus pain. You develop a severe head ache or visual changes. Your symptoms persist after you have completed your treatment plan.  Make sure you Understand  these instructions. Will watch your condition. Will get help right away if you are not doing well or get worse.   Thank you for choosing an e-visit.  Your e-visit answers were reviewed by a board certified advanced clinical practitioner to complete your personal care plan. Depending upon the condition, your plan could have included both over the counter or prescription medications.  Please review your pharmacy choice. Make sure the pharmacy is open so you can pick up prescription now. If there is a problem, you may contact your provider through and have the prescription routed to another pharmacy.  Your safety is important to [employer]. If you have drug allergies check your prescription carefully.   For the next 24 hours you can use MyChart to ask questions about today's visit, request a non-urgent call back, or ask for a work or school excuse. You will get an email in the next two days asking about your experience. I hope that your e-visit has been valuable and will speed your recovery.

## 2022-04-14 NOTE — Addendum Note (Signed)
Addended by: Guy Sandifer on: 04/14/2022 07:46 AM   Modules accepted: Orders

## 2022-04-16 ENCOUNTER — Encounter: Payer: Self-pay | Admitting: Internal Medicine

## 2022-04-18 ENCOUNTER — Telehealth: Payer: Self-pay | Admitting: Nurse Practitioner

## 2022-04-18 NOTE — Telephone Encounter (Signed)
Reached out to the pt bc she sent a mychart message wanting medication w/o an OV for sic sx's. Called to make an appt, she said she would prefer to ride the weekend out an dif she isn't better call back on monday

## 2022-06-05 ENCOUNTER — Other Ambulatory Visit: Payer: Self-pay | Admitting: Nurse Practitioner

## 2022-06-05 DIAGNOSIS — F419 Anxiety disorder, unspecified: Secondary | ICD-10-CM

## 2022-07-16 DIAGNOSIS — M25541 Pain in joints of right hand: Secondary | ICD-10-CM | POA: Diagnosis not present

## 2022-07-16 DIAGNOSIS — M79641 Pain in right hand: Secondary | ICD-10-CM | POA: Diagnosis not present

## 2022-07-16 DIAGNOSIS — M79642 Pain in left hand: Secondary | ICD-10-CM | POA: Diagnosis not present

## 2022-07-16 DIAGNOSIS — M159 Polyosteoarthritis, unspecified: Secondary | ICD-10-CM | POA: Diagnosis not present

## 2022-08-06 DIAGNOSIS — H2513 Age-related nuclear cataract, bilateral: Secondary | ICD-10-CM | POA: Diagnosis not present

## 2022-08-06 DIAGNOSIS — H02884 Meibomian gland dysfunction left upper eyelid: Secondary | ICD-10-CM | POA: Diagnosis not present

## 2022-08-06 DIAGNOSIS — H02882 Meibomian gland dysfunction right lower eyelid: Secondary | ICD-10-CM | POA: Diagnosis not present

## 2022-08-06 DIAGNOSIS — H5203 Hypermetropia, bilateral: Secondary | ICD-10-CM | POA: Diagnosis not present

## 2022-08-07 ENCOUNTER — Encounter: Payer: Self-pay | Admitting: Internal Medicine

## 2022-08-07 ENCOUNTER — Ambulatory Visit (INDEPENDENT_AMBULATORY_CARE_PROVIDER_SITE_OTHER): Payer: Medicare Other | Admitting: Internal Medicine

## 2022-08-07 VITALS — BP 124/80 | HR 70 | Temp 97.9°F | Resp 16 | Ht 67.5 in | Wt 142.2 lb

## 2022-08-07 DIAGNOSIS — R7309 Other abnormal glucose: Secondary | ICD-10-CM

## 2022-08-07 DIAGNOSIS — Z79899 Other long term (current) drug therapy: Secondary | ICD-10-CM | POA: Diagnosis not present

## 2022-08-07 DIAGNOSIS — Z0001 Encounter for general adult medical examination with abnormal findings: Secondary | ICD-10-CM

## 2022-08-07 DIAGNOSIS — E559 Vitamin D deficiency, unspecified: Secondary | ICD-10-CM | POA: Diagnosis not present

## 2022-08-07 DIAGNOSIS — Z8249 Family history of ischemic heart disease and other diseases of the circulatory system: Secondary | ICD-10-CM

## 2022-08-07 DIAGNOSIS — Z136 Encounter for screening for cardiovascular disorders: Secondary | ICD-10-CM | POA: Diagnosis not present

## 2022-08-07 DIAGNOSIS — Z0000 Encounter for general adult medical examination without abnormal findings: Secondary | ICD-10-CM

## 2022-08-07 DIAGNOSIS — Z1211 Encounter for screening for malignant neoplasm of colon: Secondary | ICD-10-CM

## 2022-08-07 DIAGNOSIS — E782 Mixed hyperlipidemia: Secondary | ICD-10-CM | POA: Diagnosis not present

## 2022-08-07 DIAGNOSIS — N951 Menopausal and female climacteric states: Secondary | ICD-10-CM

## 2022-08-07 DIAGNOSIS — R0989 Other specified symptoms and signs involving the circulatory and respiratory systems: Secondary | ICD-10-CM

## 2022-08-07 DIAGNOSIS — R5383 Other fatigue: Secondary | ICD-10-CM

## 2022-08-07 MED ORDER — ESTRADIOL 0.1 MG/24HR TD PTTW
MEDICATED_PATCH | TRANSDERMAL | 3 refills | Status: DC
Start: 2022-08-07 — End: 2022-08-08

## 2022-08-07 NOTE — Progress Notes (Signed)
Annual Screening/Preventative Visit & Comprehensive Evaluation &  Examination   Future Appointments  Date Time Provider Department  08/07/2022 11:00 AM Lucky Cowboy, MD [facility]  08/18/2023 11:00 AM Lucky Cowboy, MD [facility]       This very nice 67 y.o. MWF presents for a Screening /Preventative Visit & comprehensive evaluation and management of multiple medical co-morbidities.  Patient has been followed expectantly /screened for elevated BP, abnormal lipids, Prediabetes  and Vitamin D Deficiency.        Labile HTN predates since  2000. Patient's BP has been controlled at home and patient denies any cardiac symptoms as chest pain, palpitations, shortness of breath, dizziness or ankle swelling. Today's BP is at goal - 124/80.        Patient's hyperlipidemia is not controlled with diet . Last lipids were not at goal :  Lab Results  Component Value Date   CHOL 197 08/06/2021   HDL 94 08/06/2021   LDLCALC 87 08/06/2021   TRIG 70 08/06/2021   CHOLHDL 2.1 08/06/2021         Patient is monitored expectantly for glucose intolerance and patient denies reactive hypoglycemic symptoms, visual blurring, diabetic polys or paresthesias. Last A1c was normal & at goal:  Lab Results  Component Value Date   HGBA1C 5.3 08/06/2021         Finally, patient has history of Vitamin D Deficiency and last Vitamin D was at goal :  Lab Results  Component Value Date   VD25OH 72 08/06/2021       Current Outpatient Medications  Medication Instructions   ALPRAZolam ( 0.5 MG tablet TAKE 1/2 TO 1 TABLET 2 TO 3 TIMES DAILY   WELLBUTRIN XL 300 MG  Take 1 tablet Daily for Mood, Focus & Concentration   FIORICET TAKE 1 TAB  EVERY 4 HRS AS NEEDED   Cholecalciferol (VITAMIN D PO) 2 x / eweek   MINIVELLE 0.1 MG/24HR patch      Allergies  Allergen Reactions   Ciprofloxacin     headaches   Luvox [Fluvoxamine] Nausea And Vomiting   Sulfa Antibiotics Rash     Past Medical History:   Diagnosis Date   Chronic tension headaches    Depression    IBS (irritable bowel syndrome)    Labile hypertension    Vitamin D deficiency      Health Maintenance  Topic Date Due   Zoster Vaccines- Shingrix (1 of 2) Never done   MAMMOGRAM  08/11/2009   COVID-19 Vaccine (3 - Moderna risk series) 08/17/2019   Pneumonia Vaccine 78+ Years old (1 - PCV) 12/12/2019   DEXA SCAN  Never done   INFLUENZA VACCINE  11/13/2021   COLONOSCOPY (Pts 45-15yrs Insurance coverage will need to be confirmed)  08/02/2026   TETANUS/TDAP  09/01/2028   Hepatitis C Screening  Completed   HPV VACCINES  Aged Out     Immunization History  Administered Date(s) Administered   Influenza Split 02/02/2014, 02/13/2015   Influenza,inj,Quad 01/12/2019   Influenza,inj,quad 03/19/2016   Moderna Sars-Covid-2 Vacc 06/22/2019, 07/20/2019   PPD Test 05/23/2017, 06/24/2018, 07/26/2019   Pneumococcal-23 01/27/2012   Tdap 01/10/2010, 09/02/2018    Last Colon - 07/25/2016 - Dr Joya San - recommended 10 yr f/u in Apr 2028   Last MGM - 10/12/19 - Dr Jennette Kettle    Past Surgical History:  Procedure Laterality Date   ABDOMINAL HYSTERECTOMY     total hysterectomy   BILATERAL TEMPOROMANDIBULAR JOINT ARTHROPLASTY  1986, 1988  HEMICOLECTOMY  1991   TUBAL LIGATION  1981     Family History  Problem Relation Age of Onset   Cancer Mother        lung   Cancer Father        colon   Alzheimer's disease Father      Social History   Tobacco Use   Smoking status: Never   Smokeless tobacco: Never  Vaping Use   Vaping Use: Never used  Substance Use Topics   Alcohol use: Yes    Comment: Social   Drug use: No      ROS Constitutional: Denies fever, chills, weight loss/gain, headaches, insomnia,  night sweats, and change in appetite. Does c/o fatigue. Eyes: Denies redness, blurred vision, diplopia, discharge, itchy, watery eyes.  ENT: Denies discharge, congestion, post nasal drip, epistaxis, sore throat, earache,  hearing loss, dental pain, Tinnitus, Vertigo, Sinus pain, snoring.  Cardio: Denies chest pain, palpitations, irregular heartbeat, syncope, dyspnea, diaphoresis, orthopnea, PND, claudication, edema Respiratory: denies cough, dyspnea, DOE, pleurisy, hoarseness, laryngitis, wheezing.  Gastrointestinal: Denies dysphagia, heartburn, reflux, water brash, pain, cramps, nausea, vomiting, bloating, diarrhea, constipation, hematemesis, melena, hematochezia, jaundice, hemorrhoids Genitourinary: Denies dysuria, frequency, urgency, nocturia, hesitancy, discharge, hematuria, flank pain Breast: Breast lumps, nipple discharge, bleeding.  Musculoskeletal: Denies arthralgia, myalgia, stiffness, Jt. Swelling, pain, limp, and strain/sprain. Denies falls. Skin: Denies puritis, rash, hives, warts, acne, eczema, changing in skin lesion Neuro: No weakness, tremor, incoordination, spasms, paresthesia, pain Psychiatric: Denies confusion, memory loss, sensory loss. Denies Depression. Endocrine: Denies change in weight, skin, hair change, nocturia, and paresthesia, diabetic polys, visual blurring, hyper / hypo glycemic episodes.  Heme/Lymph: No excessive bleeding, bruising, enlarged lymph nodes.  Physical Exam  BP 124/80   Pulse 70   Temp 97.9 F (36.6 C)   Resp 16   Ht 5' 7.5" (1.715 m)   Wt 142 lb 3.2 oz (64.5 kg)   SpO2 99%   BMI 21.94 kg/m   General Appearance: Well nourished, well groomed and in no apparent distress.  Eyes: PERRLA, EOMs, conjunctiva no swelling or erythema, normal fundi and vessels. Sinuses: No frontal/maxillary tenderness ENT/Mouth: EACs patent / TMs  nl. Nares clear without erythema, swelling, mucoid exudates. Oral hygiene is good. No erythema, swelling, or exudate. Tongue normal, non-obstructing. Tonsils not swollen or erythematous. Hearing normal.  Neck: Supple, thyroid not palpable. No bruits, nodes or JVD. Respiratory: Respiratory effort normal.  BS equal and clear bilateral without  rales, rhonci, wheezing or stridor. Cardio: Heart sounds are normal with regular rate and rhythm and no murmurs, rubs or gallops. Peripheral pulses are normal and equal bilaterally without edema. No aortic or femoral bruits. Chest: symmetric with normal excursions and percussion. Breasts: Symmetric, without lumps, nipple discharge, retractions, or fibrocystic changes.  Abdomen: Flat, soft with bowel sounds active. Nontender, no guarding, rebound, hernias, masses, or organomegaly.  Lymphatics: Non tender without lymphadenopathy.  Genitourinary:  Musculoskeletal: Full ROM all peripheral extremities, joint stability, 5/5 strength, and normal gait. Skin: Warm and dry without rashes, lesions, cyanosis, clubbing or  ecchymosis.  Neuro: Cranial nerves intact, reflexes equal bilaterally. Normal muscle tone, no cerebellar symptoms. Sensation intact.  Pysch: Alert and oriented X 3, normal affect, Insight and Judgment appropriate.    Assessment and Plan  1. Annual Preventative Screening Examination   2. Labile hypertension  - EKG 12-Lead - Urinalysis, Routine w reflex microscopic - Microalbumin / creatinine urine ratio - CBC with Differential/Platelet - COMPLETE METABOLIC PANEL WITH GFR - Magnesium - TSH  3.  Hyperlipidemia, mixed  - EKG 12-Lead - Lipid panel - TSH  4. Abnormal glucose  - EKG 12-Lead - Hemoglobin A1c - Insulin, random  5. Vitamin D deficiency  - VITAMIN D 25 Hydroxy  6. Screening for colorectal cancer  - POC Hemoccult Bld/Stl   7. Screening for heart disease  - EKG 12-Lead  8. FHx: heart disease  - EKG 12-Lead  9. Fatigue, unspecified type  - CBC with Differential/Platelet - TSH  10. Medication management  - Urinalysis, Routine w reflex microscopic - Microalbumin / creatinine urine ratio - CBC with Differential/Platelet - COMPLETE METABOLIC PANEL WITH GFR - Magnesium - Lipid panel - Hemoglobin A1c - Insulin, random - VITAMIN D 25 Hydroxy            Patient was counseled in prudent diet to achieve/maintain BMI less than 25 for weight control, BP monitoring, regular exercise and medications. Discussed med's effects and SE's. Screening labs and tests as requested with regular follow-up as recommended. Over 40 minutes of exam, counseling, chart review and high complex critical decision making was performed.   Marinus Maw, MD

## 2022-08-07 NOTE — Patient Instructions (Signed)

## 2022-08-08 LAB — TSH: TSH: 1.42 mIU/L (ref 0.40–4.50)

## 2022-08-08 LAB — CBC WITH DIFFERENTIAL/PLATELET
Absolute Monocytes: 437 cells/uL (ref 200–950)
Basophils Absolute: 78 cells/uL (ref 0–200)
Basophils Relative: 1.7 %
Eosinophils Absolute: 198 cells/uL (ref 15–500)
Eosinophils Relative: 4.3 %
HCT: 37.3 % (ref 35.0–45.0)
Hemoglobin: 12 g/dL (ref 11.7–15.5)
Lymphs Abs: 1458 cells/uL (ref 850–3900)
MCH: 27.5 pg (ref 27.0–33.0)
MCHC: 32.2 g/dL (ref 32.0–36.0)
MCV: 85.6 fL (ref 80.0–100.0)
MPV: 10.8 fL (ref 7.5–12.5)
Monocytes Relative: 9.5 %
Neutro Abs: 2429 cells/uL (ref 1500–7800)
Neutrophils Relative %: 52.8 %
Platelets: 306 10*3/uL (ref 140–400)
RBC: 4.36 10*6/uL (ref 3.80–5.10)
RDW: 12.2 % (ref 11.0–15.0)
Total Lymphocyte: 31.7 %
WBC: 4.6 10*3/uL (ref 3.8–10.8)

## 2022-08-08 LAB — COMPLETE METABOLIC PANEL WITH GFR
AG Ratio: 2 (calc) (ref 1.0–2.5)
ALT: 17 U/L (ref 6–29)
AST: 26 U/L (ref 10–35)
Albumin: 4.5 g/dL (ref 3.6–5.1)
Alkaline phosphatase (APISO): 50 U/L (ref 37–153)
BUN: 18 mg/dL (ref 7–25)
CO2: 30 mmol/L (ref 20–32)
Calcium: 9.8 mg/dL (ref 8.6–10.4)
Chloride: 105 mmol/L (ref 98–110)
Creat: 0.98 mg/dL (ref 0.50–1.05)
Globulin: 2.3 g/dL (calc) (ref 1.9–3.7)
Glucose, Bld: 90 mg/dL (ref 65–99)
Potassium: 5.1 mmol/L (ref 3.5–5.3)
Sodium: 141 mmol/L (ref 135–146)
Total Bilirubin: 0.3 mg/dL (ref 0.2–1.2)
Total Protein: 6.8 g/dL (ref 6.1–8.1)
eGFR: 63 mL/min/{1.73_m2} (ref >=60)

## 2022-08-08 LAB — VITAMIN D 25 HYDROXY (VIT D DEFICIENCY, FRACTURES): Vit D, 25-Hydroxy: 106 ng/mL — ABNORMAL HIGH (ref 30–100)

## 2022-08-08 LAB — URINALYSIS, ROUTINE W REFLEX MICROSCOPIC
Bilirubin Urine: NEGATIVE
Glucose, UA: NEGATIVE
Hgb urine dipstick: NEGATIVE
Ketones, ur: NEGATIVE
Leukocytes,Ua: NEGATIVE
Nitrite: NEGATIVE
Protein, ur: NEGATIVE
Specific Gravity, Urine: 1.008 (ref 1.001–1.035)
pH: 6.5 (ref 5.0–8.0)

## 2022-08-08 LAB — INSULIN, RANDOM: Insulin: 2.5 u[IU]/mL

## 2022-08-08 LAB — MICROALBUMIN / CREATININE URINE RATIO
Creatinine, Urine: 36 mg/dL (ref 20–275)
Microalb Creat Ratio: 6 mg/g creat (ref <30)
Microalb, Ur: 0.2 mg/dL

## 2022-08-08 LAB — LIPID PANEL
Cholesterol: 202 mg/dL — ABNORMAL HIGH (ref <200)
HDL: 83 mg/dL (ref >=50)
LDL Cholesterol (Calc): 101 mg/dL (calc) — ABNORMAL HIGH
Non-HDL Cholesterol (Calc): 119 mg/dL (calc) (ref <130)
Total CHOL/HDL Ratio: 2.4 (calc) (ref <5.0)
Triglycerides: 87 mg/dL (ref <150)

## 2022-08-08 LAB — HEMOGLOBIN A1C
Hgb A1c MFr Bld: 5.6 % of total Hgb (ref <5.7)
Mean Plasma Glucose: 114 mg/dL
eAG (mmol/L): 6.3 mmol/L

## 2022-08-08 LAB — MAGNESIUM: Magnesium: 2.4 mg/dL (ref 1.5–2.5)

## 2022-08-08 MED ORDER — ESTRADIOL 0.025 MG/24HR TD PTWK
MEDICATED_PATCH | TRANSDERMAL | 3 refills | Status: DC
Start: 2022-08-08 — End: 2023-07-04

## 2022-08-08 NOTE — Progress Notes (Signed)
^<^<^<^<^<^<^<^<^<^<^<^<^<^<^<^<^<^<^<^<^<^<^<^<^<^<^<^<^<^<^<^<^<^<^<^<^ ^>^>^>^>^>^>^>^>^>^>^>>^>^>^>^>^>^>^>^>^>^>^>^>^>^>^>^>^>^>^>^>^>^>^>^>^ -Test results slightly outside the reference range are not unusual. If there is anything important, I will review this with you,  otherwise it is considered normal test values.  If you have further questions,  please do not hesitate to contact me at the office or via My Chart.  ^<^<^<^<^<^<^<^<^<^<^<^<^<^<^<^<^<^<^<^<^<^<^<^<^<^<^<^<^<^<^<^<^<^<^<^<^ ^>^>^>^>^>^>^>^>^>^>^>^>^>^>^>^>^>^>^>^>^>^>^>^>^>^>^>^>^>^>^>^>^>^>^>^>^  -   -  Total  Chol =  202     - Elevated             (  Ideal  or  Goal is less than 180  !  )  & -  Bad / Dangerous LDL  Chol = 101   - also Elevated              (  Ideal  or  Goal is less than 70  !  )   - Cholesterol is too high - Recommend  a stricter low cholesterol diet   - Cholesterol only comes from animal sources                                                                                           - ie. meat, dairy, egg yolks  - Eat all the vegetables you want.  - Avoid Meat, Avoid Meat,  Avoid Meat                                                                       - especially Red Meat - Beef AND Pork .  - Avoid cheese & dairy - milk & ice cream.     - Cheese is the most concentrated form of trans-fats which                                                                    is the worst thing to clog up our arteries.   - Veggie cheese is OK which can be found in the fresh                                     produce section at [employer] or [employer] or [employer]  ^>^>^>^>^>^>^>^>^>^>^>^>^>^>^>^>^>^>^>^>^>^>^>^>^>^>^>^>^>^>^>^>^>^>^>^>^ ^>^>^>^>^>^>^>^>^>^>^>^>^>^>^>^>^>^>^>^>^>^>^>^>^>^>^>^>^>^>^>^>^>^>^>^>^  -  Vitamin D = 106 - Slightly elevated ,   - Recommend cut back to Vit D 5,000 unit capsules  5 x / week mon MTWFSat   &                                                                   leave off Sun & Thurs  ^>^>^>^>^>^>^>^>^>^>^>^>^>^>^>^>^>^>^>^>^>^>^>^>^>^>^>^>^>^>^>^>^>^>^>^>^ ^>^>^>^>^>^>^>^>^>^>^>^>^>^>^>^>^>^>^>^>^>^>^>^>^>^>^>^>^>^>^>^>^>^>^>^>^  -  A1c  - Normal = no Diabetes   ^>^>^>^>^>^>^>^>^>^>^>^>^>^>^>^>^>^>^>^>^>^>^>^>^>^>^>^>^>^>^>^>^>^>^>^>^ ^>^>^>^>^>^>^>^>^>^>^>^>^>^>^>^>^>^>^>^>^>^>^>^>^>^>^>^>^>^>^>^>^>^>^>^>^  -  All Else - CBC - Kidneys - Electrolytes - Liver - Magnesium & Thyroid    - all  Normal / OK  ^>^>^>^>^>^>^>^>^>^>^>^>^>^>^>^>^>^>^>^>^>^>^>^>^>^>^>^>^>^>^>^>^>^>^>^>^ ^>^>^>^>^>^>^>^>^>^>^>^>^>^>^>^>^>^>^>^>^>^>^>^>^>^>^>^>^>^>^>^>^>^>^>^>^

## 2022-08-08 NOTE — Addendum Note (Signed)
Addended by: Lucky Cowboy on: 08/08/2022 11:35 PM   Modules accepted: Orders

## 2022-08-20 ENCOUNTER — Encounter (INDEPENDENT_AMBULATORY_CARE_PROVIDER_SITE_OTHER): Payer: Self-pay

## 2022-09-02 DIAGNOSIS — Z85828 Personal history of other malignant neoplasm of skin: Secondary | ICD-10-CM | POA: Diagnosis not present

## 2022-09-02 DIAGNOSIS — L821 Other seborrheic keratosis: Secondary | ICD-10-CM | POA: Diagnosis not present

## 2022-09-02 DIAGNOSIS — L578 Other skin changes due to chronic exposure to nonionizing radiation: Secondary | ICD-10-CM | POA: Diagnosis not present

## 2022-09-02 DIAGNOSIS — Z872 Personal history of diseases of the skin and subcutaneous tissue: Secondary | ICD-10-CM | POA: Diagnosis not present

## 2022-09-04 ENCOUNTER — Other Ambulatory Visit: Payer: Self-pay | Admitting: Internal Medicine

## 2022-09-04 ENCOUNTER — Encounter: Payer: Self-pay | Admitting: Internal Medicine

## 2022-09-04 DIAGNOSIS — K582 Mixed irritable bowel syndrome: Secondary | ICD-10-CM

## 2022-09-04 MED ORDER — DICYCLOMINE HCL 20 MG PO TABS
ORAL_TABLET | ORAL | 1 refills | Status: DC
Start: 2022-09-04 — End: 2024-01-20

## 2022-09-22 ENCOUNTER — Other Ambulatory Visit: Payer: Self-pay | Admitting: Internal Medicine

## 2022-09-22 DIAGNOSIS — F32A Depression, unspecified: Secondary | ICD-10-CM

## 2022-09-22 DIAGNOSIS — F419 Anxiety disorder, unspecified: Secondary | ICD-10-CM

## 2022-09-22 MED ORDER — BUPROPION HCL ER (XL) 300 MG PO TB24
ORAL_TABLET | ORAL | 3 refills | Status: DC
Start: 2022-09-22 — End: 2023-09-17

## 2022-10-15 ENCOUNTER — Other Ambulatory Visit: Payer: Self-pay | Admitting: Nurse Practitioner

## 2022-10-15 DIAGNOSIS — F419 Anxiety disorder, unspecified: Secondary | ICD-10-CM

## 2022-10-15 MED ORDER — ALPRAZOLAM 0.5 MG PO TABS
ORAL_TABLET | ORAL | 0 refills | Status: DC
Start: 2022-10-15 — End: 2023-01-29

## 2022-10-16 ENCOUNTER — Other Ambulatory Visit: Payer: Self-pay | Admitting: Nurse Practitioner

## 2022-10-16 DIAGNOSIS — G44229 Chronic tension-type headache, not intractable: Secondary | ICD-10-CM

## 2022-10-22 NOTE — Progress Notes (Unsigned)
MEDICARE ANNUAL WELLNESS VISIT AND FOLLOW UP WELCOME TO MEDICARE AND FOLLOW UP  Assessment:    1. Encounter for Medicare annual wellness exam Due annually  Health maintenance reviewed Healthily lifestyle goals set   2. Labile hypertension Controlled via lifestyle Discussed DASH (Dietary Approaches to Stop Hypertension) DASH diet is lower in sodium than a typical American diet. Cut back on foods that are high in saturated fat, cholesterol, and trans fats. Eat more whole-grain foods, fish, poultry, and nuts Remain active and exercise as tolerated daily.  Monitor BP at home-Call if greater than 130/80.  Check CMP/CBC   3. Hyperlipidemia, mixed Uncontrolled - elevated Discussed lifestyle modifications. Recommended diet heavy in fruits and veggies, omega 3's. Decrease consumption of animal meats, cheeses, and dairy products. Remain active and exercise as tolerated. Continue to monitor. Check lipids/TSH   4. Abnormal glucose Recent A1c are normal Education: Reviewed 'ABCs' of diabetes management  Discussed goals to be met and/or maintained include A1C (<7) Blood pressure (<130/80) Cholesterol (LDL <70) Continue Eye Exam yearly  Continue Dental Exam Q6 mo Discussed dietary recommendations Discussed Physical Activity recommendations Check A1C   5. Vitamin D deficiency Above goal *** Continue supplement for goal of 60-100 Monitor Vitamin D levels   6. Anxiety/Depression, controlled Continue medications Reviewed relaxation techniques.  Sleep hygiene. Recommended Cognitive Behavioral Therapy (CBT). Recommended mindfulness meditation and exercise.   Insight-oriented psychotherapy given for 16 minutes exclusively. Psychoeducation:  encouraged personality growth wand development through coping techniques and problem-solving skills. Limit/Decrease/Monitor drug/alcohol intake.    8. Irritable bowel syndrome with both constipation and diarrhea Continue Bentyl   9.  Chronic tension-type headache, not intractable Continue Fiorcet  10. Body mass index (BMI) of 20.0-20.9 in adult Discussed appropriate BMI Diet modification. Physical activity. Encouraged/praised to build confidence.   11. Medication management ***  12. Arthritis Orthopedics following  13.  Cataracts  08/2021 Derm Dr Russella Dar Dr, [place]ene Pugh - Eyes  Mammogram *** Colon ***  2022?? Future Appointments  Date Time Provider Department Center  10/23/2022 11:00 AM Adela Glimpse, NP [facility] None  02/24/2023  9:30 AM Lucky Cowboy, MD [facility] None  08/18/2023 11:00 AM Lucky Cowboy, MD [facility] None  10/23/2023 11:30 AM Adela Glimpse, NP [facility] None     Plan:   During the course of the visit the patient was educated and counseled about appropriate screening and preventive services including:   Pneumococcal vaccine  Prevnar 13 Influenza vaccine Td vaccine Screening electrocardiogram Bone densitometry screening Colorectal cancer screening Diabetes screening Glaucoma screening Nutrition counseling  Advanced directives: requested   Subjective:  Briana Charles is a 67 y.o. female who presents for Medicare Annual Wellness Visit and *** month follow up. She has IBS (irritable bowel syndrome); Labile hypertension; Vitamin D deficiency; Depression, controlled; Chronic tension-type headache, not intractable; Abnormal glucose; Hyperlipidemia, mixed; Medication management; Body mass index (BMI) of 20.0-20.9 in adult; Anxiety tension state; and Screening examination for pulmonary tuberculosis on their problem list.   BMI is There is no height or weight on file to calculate BMI., she {HAS HAS BJY:78295} been working on diet and exercise. Wt Readings from Last 3 Encounters:  08/07/22 142 lb 3.2 oz (64.5 kg)  08/06/21 146 lb 6.4 oz (66.4 kg)  08/02/20 140 lb 3.2 oz (63.6 kg)    Her blood pressure {HAS HAS NOT:18834} been controlled at home, today their BP is   She  {DOES_DOES AOZ:30865} workout. She denies chest pain, shortness of breath, dizziness.  She {ACTION; IS/IS HQI:69629528} on cholesterol  medication and denies myalgias. Her cholesterol {ACTION; IS/IS NOT:21021397} at goal. The cholesterol last visit was:   Lab Results  Component Value Date   CHOL 202 (H) 08/07/2022   HDL 83 08/07/2022   LDLCALC 101 (H) 08/07/2022   TRIG 87 08/07/2022   CHOLHDL 2.4 08/07/2022   She {Has/has not:18111} been working on diet and exercise for ***prediabetes, and denies {Symptoms; diabetes w/o none:19199}. Last A1C in the office was:  Lab Results  Component Value Date   HGBA1C 5.6 08/07/2022   Last GFR: Lab Results  Component Value Date   EGFR 63 08/07/2022   Patient is on Vitamin D supplement.   Lab Results  Component Value Date   VD25OH 106 (H) 08/07/2022      Medication Review: Current Outpatient Medications on File Prior to Visit  Medication Sig Dispense Refill   ALPRAZolam (XANAX) 0.5 MG tablet TAKE 1/2 TO 1 TABLET 2 TO 3 TIMES DAILY(ONLY IF NEEDED FOR ANXIETY ATTACK& LIMIT TO 5 DAYS/WEEK TO AVOID ADDICTION&DEMENTIA 60 tablet 0   buPROPion (WELLBUTRIN XL) 300 MG 24 hr tablet Take 1 tablet Daily for Mood, Focus & Concentration                                /                                                                   TAKE                                         BY                                                 MOUTH 90 tablet 3   butalbital-acetaminophen-caffeine (FIORICET) 50-325-40 MG tablet TAKE 1 TABLET BY MOUTH EVERY 4 HOURS AS NEEDED FOR SEVERE HEADACHE 30 tablet 0   Cholecalciferol (VITAMIN D PO) Take 5,000 Units by mouth daily.     dicyclomine (BENTYL) 20 MG tablet Take  1/2 to 1 tablet   4 x /day  before meals & Bedtime for Nausea, Cramping, Bloating or Diarrhea 120 tablet 1   estradiol (CLIMARA) 0.025 mg/24hr patch Apply 1 patch  to Skin  every 7 days 12 patch 3   No current facility-administered medications on file prior to visit.     Allergies  Allergen Reactions   Ciprofloxacin     headaches   Luvox [Fluvoxamine] Nausea And Vomiting   Sulfa Antibiotics Rash    Current Problems (verified) Patient Active Problem List   Diagnosis Date Noted   Anxiety tension state 07/26/2019   Screening examination for pulmonary tuberculosis 07/26/2019   Body mass index (BMI) of 20.0-20.9 in adult 02/13/2015   Abnormal glucose 02/02/2014   Hyperlipidemia, mixed 02/02/2014   Medication management 02/02/2014   IBS (irritable bowel syndrome)    Labile hypertension    Vitamin D deficiency    Depression, controlled    Chronic tension-type headache, not  intractable     Screening Tests Immunization History  Administered Date(s) Administered   Influenza Split 02/02/2014, 02/13/2015   Influenza,inj,Quad PF,6+ Mos 01/12/2019   Influenza,inj,quad, With Preservative 03/19/2016   Moderna Sars-Covid-2 Vaccination 06/22/2019, 07/20/2019   PPD Test 02/02/2014, 02/13/2015, 03/19/2016, 05/23/2017, 06/24/2018, 07/26/2019   Pneumococcal-Unspecified 01/27/2012   Tdap 01/10/2010, 09/02/2018   Health Maintenance  Topic Date Due   Medicare Annual Wellness (AWV)  Never done   Zoster Vaccines- Shingrix (1 of 2) Never done   MAMMOGRAM  08/11/2009   COVID-19 Vaccine (3 - Moderna risk series) 08/17/2019   Pneumonia Vaccine 53+ Years old (1 of 1 - PCV) 12/12/2019   DEXA SCAN  Never done   INFLUENZA VACCINE  11/14/2022   Colonoscopy  08/02/2026   DTaP/Tdap/Td (3 - Td or Tdap) 09/01/2028   Hepatitis C Screening  Completed   HPV VACCINES  Aged Out    Last colonoscopy: *** Mammograms: getting at *** Last pap smear/pelvic exam: ***   DEXA:***  Names of Other Physician/Practitioners you currently use: 1. [facility] here for primary care 2. ***, eye doctor, last visit *** 3. ***, dentist, last visit ***  Patient Care Team: Lucky Cowboy, MD as PCP - General (Internal Medicine) Bernette Redbird, MD as Consulting Physician (Gastroenterology)  SURGICAL HISTORY She  has a past surgical history that includes Tubal ligation (1981); Bilateral temporomandibular joint arthroplasty (1986, 1988); Hemicolectomy (1991); and Abdominal hysterectomy. FAMILY HISTORY Her family history includes Alzheimer's disease in her father; Cancer in her father and mother. SOCIAL HISTORY She  reports that she has never smoked. She has never used smokeless tobacco. She reports current alcohol use. She reports that she does not use drugs.   MEDICARE WELLNESS OBJECTIVES: Physical activity:   Cardiac risk factors:   Depression/mood screen:      08/01/2020   10:19 PM  Depression screen PHQ 2/9  Decreased Interest 0  Down, Depressed, Hopeless 0  PHQ - 2 Score 0    ADLs:      No data to display           Cognitive Testing  Alert? Yes  Normal Appearance?Yes  Oriented to person? Yes  Place? Yes   Time? Yes  Recall of three objects?  Yes  Can perform simple calculations? Yes  Displays appropriate judgment?Yes  Can read the correct time from a watch face?Yes  EOL planning:    ROS   Objective:     There were no vitals filed for this visit. There is no height or weight on file to calculate BMI.  General appearance: alert, no distress, WD/WN, female HEENT: normocephalic, sclerae anicteric, TMs pearly, nares patent, no discharge or erythema, pharynx normal Oral cavity: MMM, no lesions Neck: supple, no lymphadenopathy, no thyromegaly, no masses Heart: RRR, normal S1, S2, no murmurs Lungs: CTA bilaterally, no wheezes, rhonchi, or rales Abdomen: +bs, soft, non tender, non distended, no masses, no hepatomegaly, no splenomegaly Musculoskeletal: nontender, no swelling, no obvious deformity Extremities: no edema, no cyanosis, no clubbing Pulses: 2+ symmetric, upper and lower extremities, normal cap refill Neurological: alert, oriented x 3, CN2-12 intact, strength normal upper extremities and  lower extremities, sensation normal throughout, DTRs 2+ throughout, no cerebellar signs, gait normal Psychiatric: normal affect, behavior normal, pleasant   EKG: *** AAA [place]: ***  Medicare Attestation I have personally reviewed: The patient's medical and social history Their use of alcohol, tobacco or illicit drugs Their current medications and supplements The patient's functional  ability including ADLs,fall risks, home safety risks, cognitive, and hearing and visual impairment Diet and physical activities Evidence for depression or mood disorders  The patient's weight, height, BMI, and visual acuity have been recorded in the chart.  I have made referrals, counseling, and provided education to the patient based on review of the above and I have provided the patient with a written personalized care plan for preventive services.     Adela Glimpse, NP   10/22/2022

## 2022-10-23 ENCOUNTER — Encounter: Payer: Self-pay | Admitting: Nurse Practitioner

## 2022-10-23 ENCOUNTER — Ambulatory Visit (INDEPENDENT_AMBULATORY_CARE_PROVIDER_SITE_OTHER): Payer: Medicare Other | Admitting: Nurse Practitioner

## 2022-10-23 VITALS — BP 104/70 | HR 82 | Temp 97.7°F | Ht 67.5 in | Wt 142.0 lb

## 2022-10-23 DIAGNOSIS — E559 Vitamin D deficiency, unspecified: Secondary | ICD-10-CM

## 2022-10-23 DIAGNOSIS — R0989 Other specified symptoms and signs involving the circulatory and respiratory systems: Secondary | ICD-10-CM

## 2022-10-23 DIAGNOSIS — Z6820 Body mass index (BMI) 20.0-20.9, adult: Secondary | ICD-10-CM

## 2022-10-23 DIAGNOSIS — R7309 Other abnormal glucose: Secondary | ICD-10-CM

## 2022-10-23 DIAGNOSIS — Z0001 Encounter for general adult medical examination with abnormal findings: Secondary | ICD-10-CM | POA: Diagnosis not present

## 2022-10-23 DIAGNOSIS — R6889 Other general symptoms and signs: Secondary | ICD-10-CM | POA: Diagnosis not present

## 2022-10-23 DIAGNOSIS — H2513 Age-related nuclear cataract, bilateral: Secondary | ICD-10-CM

## 2022-10-23 DIAGNOSIS — E782 Mixed hyperlipidemia: Secondary | ICD-10-CM | POA: Diagnosis not present

## 2022-10-23 DIAGNOSIS — K582 Mixed irritable bowel syndrome: Secondary | ICD-10-CM | POA: Diagnosis not present

## 2022-10-23 DIAGNOSIS — Z0000 Encounter for general adult medical examination without abnormal findings: Secondary | ICD-10-CM

## 2022-10-23 DIAGNOSIS — G44229 Chronic tension-type headache, not intractable: Secondary | ICD-10-CM

## 2022-10-23 DIAGNOSIS — Z79899 Other long term (current) drug therapy: Secondary | ICD-10-CM | POA: Diagnosis not present

## 2022-10-23 DIAGNOSIS — F32A Depression, unspecified: Secondary | ICD-10-CM

## 2022-10-23 DIAGNOSIS — M8588 Other specified disorders of bone density and structure, other site: Secondary | ICD-10-CM

## 2022-10-23 DIAGNOSIS — M1990 Unspecified osteoarthritis, unspecified site: Secondary | ICD-10-CM

## 2022-10-23 DIAGNOSIS — F419 Anxiety disorder, unspecified: Secondary | ICD-10-CM

## 2022-10-23 NOTE — Patient Instructions (Signed)

## 2022-10-24 LAB — LIPID PANEL
Cholesterol: 197 mg/dL (ref <200)
HDL: 94 mg/dL (ref >=50)
LDL Cholesterol (Calc): 84 mg/dL (calc)
Non-HDL Cholesterol (Calc): 103 mg/dL (calc) (ref <130)
Total CHOL/HDL Ratio: 2.1 (calc) (ref <5.0)
Triglycerides: 99 mg/dL (ref <150)

## 2022-10-24 LAB — VITAMIN D 25 HYDROXY (VIT D DEFICIENCY, FRACTURES): Vit D, 25-Hydroxy: 98 ng/mL (ref 30–100)

## 2022-10-28 ENCOUNTER — Encounter: Payer: Self-pay | Admitting: Nurse Practitioner

## 2022-10-28 DIAGNOSIS — M1811 Unilateral primary osteoarthritis of first carpometacarpal joint, right hand: Secondary | ICD-10-CM | POA: Diagnosis not present

## 2022-10-28 DIAGNOSIS — M1812 Unilateral primary osteoarthritis of first carpometacarpal joint, left hand: Secondary | ICD-10-CM | POA: Diagnosis not present

## 2022-11-19 ENCOUNTER — Ambulatory Visit: Payer: Medicare Other | Admitting: Nurse Practitioner

## 2022-11-21 ENCOUNTER — Other Ambulatory Visit: Payer: Self-pay | Admitting: Internal Medicine

## 2022-11-21 DIAGNOSIS — Z1231 Encounter for screening mammogram for malignant neoplasm of breast: Secondary | ICD-10-CM

## 2022-12-05 ENCOUNTER — Ambulatory Visit
Admission: RE | Admit: 2022-12-05 | Discharge: 2022-12-05 | Disposition: A | Discharge status: Home / Self Care | Payer: Medicare Other | Source: Ambulatory Visit

## 2022-12-05 DIAGNOSIS — Z1231 Encounter for screening mammogram for malignant neoplasm of breast: Secondary | ICD-10-CM

## 2023-01-09 ENCOUNTER — Other Ambulatory Visit: Payer: Self-pay | Admitting: Nurse Practitioner

## 2023-01-09 DIAGNOSIS — G44229 Chronic tension-type headache, not intractable: Secondary | ICD-10-CM

## 2023-01-15 ENCOUNTER — Other Ambulatory Visit: Payer: Self-pay

## 2023-01-15 DIAGNOSIS — Z1211 Encounter for screening for malignant neoplasm of colon: Secondary | ICD-10-CM

## 2023-01-15 LAB — POC HEMOCCULT BLD/STL (HOME/3-CARD/SCREEN)
Card #2 Fecal Occult Blod, POC: NEGATIVE
Card #3 Fecal Occult Blood, POC: NEGATIVE
Fecal Occult Blood, POC: NEGATIVE

## 2023-01-16 DIAGNOSIS — Z1212 Encounter for screening for malignant neoplasm of rectum: Secondary | ICD-10-CM

## 2023-01-16 DIAGNOSIS — Z1211 Encounter for screening for malignant neoplasm of colon: Secondary | ICD-10-CM

## 2023-01-29 ENCOUNTER — Other Ambulatory Visit: Payer: Self-pay | Admitting: Nurse Practitioner

## 2023-01-29 DIAGNOSIS — F419 Anxiety disorder, unspecified: Secondary | ICD-10-CM

## 2023-02-24 ENCOUNTER — Ambulatory Visit: Payer: Medicare Other | Admitting: Internal Medicine

## 2023-02-26 ENCOUNTER — Encounter: Payer: Self-pay | Admitting: Internal Medicine

## 2023-02-26 ENCOUNTER — Ambulatory Visit (INDEPENDENT_AMBULATORY_CARE_PROVIDER_SITE_OTHER): Payer: Medicare Other | Admitting: Internal Medicine

## 2023-02-26 VITALS — BP 110/82 | HR 86 | Temp 97.9°F | Resp 16 | Ht 67.5 in | Wt 145.2 lb

## 2023-02-26 DIAGNOSIS — Z79899 Other long term (current) drug therapy: Secondary | ICD-10-CM | POA: Diagnosis not present

## 2023-02-26 DIAGNOSIS — R0989 Other specified symptoms and signs involving the circulatory and respiratory systems: Secondary | ICD-10-CM | POA: Diagnosis not present

## 2023-02-26 DIAGNOSIS — R208 Other disturbances of skin sensation: Secondary | ICD-10-CM | POA: Diagnosis not present

## 2023-02-26 DIAGNOSIS — E559 Vitamin D deficiency, unspecified: Secondary | ICD-10-CM | POA: Diagnosis not present

## 2023-02-26 DIAGNOSIS — E782 Mixed hyperlipidemia: Secondary | ICD-10-CM | POA: Diagnosis not present

## 2023-02-26 DIAGNOSIS — R7309 Other abnormal glucose: Secondary | ICD-10-CM | POA: Diagnosis not present

## 2023-02-26 MED ORDER — GABAPENTIN 100 MG PO CAPS: ORAL_CAPSULE | ORAL | 2 refills | Status: DC

## 2023-02-26 NOTE — Progress Notes (Signed)
Future Appointments  Date Time Provider Department  02/26/2023                 6 mo ov  11:00 AM Lucky Cowboy, MD [facility]  08/18/2023                cpe 11:00 AM Lucky Cowboy, MD [facility]  History of Present Illness:       This very nice 67 y.o. MWF  followed for labile elevated BP, abnormal lipids, Prediabetes  and Vitamin D Deficiency  presents for 6 month follow up .        Patient is followed expectantly for labile HTN (2000) & BP has been controlled .   Today's BP is at goal - 110/82. Patient has had no complaints of any cardiac type chest pain, palpitations, dyspnea, orthopnea /PND, dizziness, claudication or dependent edema.        Hyperlipidemia is controlled with diet & meds. Patient denies myalgias or other med SE's. Last Lipids were at goal :  Lab Results  Component Value Date   CHOL 197 10/23/2022   HDL 94 10/23/2022   LDLCALC 84 10/23/2022   TRIG 99 10/23/2022   CHOLHDL 2.1 10/23/2022     Also, the patient is monitored expectantly for glucose intolerance & denies any symptoms of reactive hypoglycemia, diabetic polys, paresthesias or visual blurring.  Last A1c was Normal & at goal :  Lab Results  Component Value Date   HGBA1C 5.6 08/07/2022                                                          Further, the patient also has history of Vitamin D Deficiency and supplements vitamin D . Last vitamin D was at goal :  Lab Results  Component Value Date   VD25OH 98 10/23/2022     Current Outpatient Medications on File Prior to Visit  Medication Sig   ALPRAZolam (XANAX) 0.5 MG tablet TAKE 1/2-1 TABLET 2 TO 3 TIMES DAILY IF NEEDED    buPROPion  XL 300 MG 2 Take 1 tablet Daily   FIORICET TAKE 1 TABLET EVERY 4 HRS AS NEEDED FOR SEVERE HEADACHE   VITAMIN D  5,000 Units  Take  daily.   dicyclomine 20 MG tablet Take  1/2 to 1 tablet   4 x /day  before meals & Bedtime for Nausea, Cramping, Bloating or Diarrhea   estradiol (CLIMARA) 0.025 mg/24hr  patch Apply 1 patch  to Skin  every 7 days     Allergies  Allergen Reactions   Ciprofloxacin     headaches   Luvox [Fluvoxamine] Nausea And Vomiting   Sulfa Antibiotics Rash     PMHx:   Past Medical History:  Diagnosis Date   Chronic tension headaches    Depression    IBS (irritable bowel syndrome)    Labile hypertension    Vitamin D deficiency      Immunization History  Administered Date(s) Administered   Influenza Split 02/02/2014, 02/13/2015   Influenza,inj,Quad  01/12/2019   Influenza,inj,quad 03/19/2016   Moderna Sars-Covid-2  06/22/2019, 07/20/2019   PPD Test 05/23/2017, 06/24/2018, 07/26/2019   Pneumococcal-23 01/27/2012   Tdap 01/10/2010, 09/02/2018     Past Surgical History:  Procedure Laterality Date   ABDOMINAL HYSTERECTOMY  total hysterectomy   BILATERAL TEMPOROMANDIBULAR JOINT ARTHROPLASTY  1986, 1988   HEMICOLECTOMY  1991   TUBAL LIGATION  1981    FHx:    Reviewed / unchanged   SHx:    Reviewed / unchanged    Systems Review:  Constitutional: Denies fever, chills, wt changes, headaches, insomnia, fatigue, night sweats, change in appetite. Eyes: Denies redness, blurred vision, diplopia, discharge, itchy, watery eyes.  ENT: Denies discharge, congestion, post nasal drip, epistaxis, sore throat, earache, hearing loss, dental pain, tinnitus, vertigo, sinus pain, snoring.  CV: Denies chest pain, palpitations, irregular heartbeat, syncope, dyspnea, diaphoresis, orthopnea, PND, claudication or edema. Respiratory: denies cough, dyspnea, DOE, pleurisy, hoarseness, laryngitis, wheezing.  Gastrointestinal: Denies dysphagia, odynophagia, heartburn, reflux, water brash, abdominal pain or cramps, nausea, vomiting, bloating, diarrhea, constipation, hematemesis, melena, hematochezia  or hemorrhoids. Genitourinary: Denies dysuria, frequency, urgency, nocturia, hesitancy, discharge, hematuria or flank pain. Musculoskeletal: Denies arthralgias, myalgias,  stiffness, jt. swelling, pain, limping or strain/sprain.  Skin: Denies pruritus, rash, hives, warts, acne, eczema or change in skin lesion(s). Neuro: No weakness, tremor, incoordination, spasms, paresthesia or pain. Psychiatric: Denies confusion, memory loss or sensory loss. Endo: Denies change in weight, skin or hair change.  Heme/Lymph: No excessive bleeding, bruising or enlarged lymph nodes.   Physical Exam  BP 110/82   Pulse 86   Temp 97.9 F (36.6 C)   Resp 16   Ht 5' 7.5" (1.715 m)   Wt 145 lb 3.2 oz (65.9 kg)   SpO2 98%   BMI 22.41 kg/m   Appears  well nourished, well groomed  and in no distress.  Eyes: PERRLA, EOMs, conjunctiva no swelling or erythema. Sinuses: No frontal/maxillary tenderness ENT/Mouth: EAC's clear, TM's nl w/o erythema, bulging. Nares clear w/o erythema, swelling, exudates. Oropharynx clear without erythema or exudates. Oral hygiene is good. Tongue normal, non obstructing. Hearing intact.  Neck: Supple. Thyroid not palpable. Car 2+/2+ without bruits, nodes or JVD. Chest: Respirations nl with BS clear & equal w/o rales, rhonchi, wheezing or stridor.  Cor: Heart sounds normal w/ regular rate and rhythm without sig. murmurs, gallops, clicks or rubs. Peripheral pulses normal and equal  without edema.  Abdomen: Soft & bowel sounds normal. Non-tender w/o guarding, rebound, hernias, masses or organomegaly.  Lymphatics: Unremarkable.  Musculoskeletal: Full ROM all peripheral extremities, joint stability, 5/5 strength and normal gait.  Skin: Warm, dry without exposed rashes, lesions or ecchymosis apparent.  Neuro: Cranial nerves intact, reflexes equal bilaterally. Sensory-motor testing grossly intact. Tendon reflexes grossly intact.  Pysch: Alert & oriented x 3.  Insight and judgement nl & appropriate. No ideations.   Assessment and Plan:   1. Labile hypertension  - Continue medication, monitor blood pressure at home.  - Continue DASH diet.  Reminder to go  to the ER if any CP,  SOB, nausea, dizziness, severe HA, changes vision/speech.   - CBC with Differential/Platelet - COMPLETE METABOLIC PANEL WITH GFR - Magnesium - TSH   2. Hyperlipidemia, mixed  - Continue diet/meds, exercise,& lifestyle modifications.  - Continue monitor periodic cholesterol/liver & renal functions    - Lipid panel - TSH   3. Abnormal glucose  - Continue diet, exercise  - Lifestyle modifications.  - Monitor appropriate labs   - Hemoglobin A1c - Insulin, random   4. Vitamin D deficiency  - Continue supplementation    - VITAMIN D 25 Hydroxy    5. Medication management  - CBC with Differential/Platelet - COMPLETE METABOLIC PANEL WITH GFR - Magnesium - Lipid panel -  TSH - Hemoglobin A1c - Insulin, random - VITAMIN D 25 Hydroxy         Discussed  regular exercise, BP monitoring, weight control to achieve/maintain BMI less than 25 and discussed med and SE's. Recommended labs to assess /monitor clinical status .  I discussed the assessment and treatment plan with the patient. The patient was provided an opportunity to ask questions and all were answered. The patient agreed with the plan and demonstrated an understanding of the instructions.  I provided over 30 minutes of exam, counseling, chart review and  complex critical decision making.       Marinus Maw, MD

## 2023-02-26 NOTE — Patient Instructions (Signed)

## 2023-02-26 NOTE — Addendum Note (Signed)
Addended by: Lucky Cowboy on: 02/26/2023 11:18 PM   Modules accepted: Orders

## 2023-02-27 LAB — CBC WITH DIFFERENTIAL/PLATELET
Absolute Lymphocytes: 1420 {cells}/uL (ref 850–3900)
Absolute Monocytes: 421 {cells}/uL (ref 200–950)
Basophils Absolute: 68 {cells}/uL (ref 0–200)
Basophils Relative: 1.3 %
Eosinophils Absolute: 239 {cells}/uL (ref 15–500)
Eosinophils Relative: 4.6 %
HCT: 37.4 % (ref 35.0–45.0)
Hemoglobin: 12.3 g/dL (ref 11.7–15.5)
MCH: 28.9 pg (ref 27.0–33.0)
MCHC: 32.9 g/dL (ref 32.0–36.0)
MCV: 88 fL (ref 80.0–100.0)
MPV: 10.7 fL (ref 7.5–12.5)
Monocytes Relative: 8.1 %
Neutro Abs: 3052 {cells}/uL (ref 1500–7800)
Neutrophils Relative %: 58.7 %
Platelets: 307 10*3/uL (ref 140–400)
RBC: 4.25 10*6/uL (ref 3.80–5.10)
RDW: 12.1 % (ref 11.0–15.0)
Total Lymphocyte: 27.3 %
WBC: 5.2 10*3/uL (ref 3.8–10.8)

## 2023-02-27 LAB — VITAMIN D 25 HYDROXY (VIT D DEFICIENCY, FRACTURES): Vit D, 25-Hydroxy: 80 ng/mL (ref 30–100)

## 2023-02-27 LAB — COMPLETE METABOLIC PANEL WITH GFR
AG Ratio: 1.8 (calc) (ref 1.0–2.5)
ALT: 17 U/L (ref 6–29)
AST: 20 U/L (ref 10–35)
Albumin: 4.3 g/dL (ref 3.6–5.1)
Alkaline phosphatase (APISO): 53 U/L (ref 37–153)
BUN: 21 mg/dL (ref 7–25)
CO2: 29 mmol/L (ref 20–32)
Calcium: 9.7 mg/dL (ref 8.6–10.4)
Chloride: 104 mmol/L (ref 98–110)
Creat: 0.89 mg/dL (ref 0.50–1.05)
Globulin: 2.4 g/dL (ref 1.9–3.7)
Glucose, Bld: 87 mg/dL (ref 65–99)
Potassium: 5.3 mmol/L (ref 3.5–5.3)
Sodium: 139 mmol/L (ref 135–146)
Total Bilirubin: 0.3 mg/dL (ref 0.2–1.2)
Total Protein: 6.7 g/dL (ref 6.1–8.1)
eGFR: 71 mL/min/{1.73_m2} (ref >=60)

## 2023-02-27 LAB — HEMOGLOBIN A1C
Hgb A1c MFr Bld: 5.4 %{Hb} (ref <5.7)
Mean Plasma Glucose: 108 mg/dL
eAG (mmol/L): 6 mmol/L

## 2023-02-27 LAB — TSH: TSH: 1.29 m[IU]/L (ref 0.40–4.50)

## 2023-02-27 LAB — LIPID PANEL
Cholesterol: 208 mg/dL — ABNORMAL HIGH (ref <200)
HDL: 78 mg/dL (ref >=50)
LDL Cholesterol (Calc): 114 mg/dL — ABNORMAL HIGH
Non-HDL Cholesterol (Calc): 130 mg/dL — ABNORMAL HIGH (ref <130)
Total CHOL/HDL Ratio: 2.7 (calc) (ref <5.0)
Triglycerides: 70 mg/dL (ref <150)

## 2023-02-27 LAB — INSULIN, RANDOM: Insulin: 2.9 u[IU]/mL

## 2023-02-27 LAB — MAGNESIUM: Magnesium: 2.4 mg/dL (ref 1.5–2.5)

## 2023-02-27 NOTE — Progress Notes (Signed)
<>*<>*<>*<>*<>*<>*<>*<>*<>*<>*<>*<>*<>*<>*<>*<>*<>*<>*<>*<>*<>*<>*<>*<>*<> <>*<>*<>*<>*<>*<>*<>*<>*<>*<>*<>*<>*<>*<>*<>*<>*<>*<>*<>*<>*<>*<>*<>*<>*<>  -Test results slightly outside the reference range are not unusual. If there is anything important, I will review this with you,  otherwise it is considered normal test values.  If you have further questions,  please do not hesitate to contact me at the office or via My Chart.   <>*<>*<>*<>*<>*<>*<>*<>*<>*<>*<>*<>*<>*<>*<>*<>*<>*<>*<>*<>*<>*<>*<>*<>*<> <>*<>*<>*<>*<>*<>*<>*<>*<>*<>*<>*<>*<>*<>*<>*<>*<>*<>*<>*<>*<>*<>*<>*<>*<>   -  Total  Chol =  208     - Elevated             (  Ideal  or  Goal is less than 180  !  )  & -  Bad / Dangerous LDL  Chol = 114   - also Elevated              (  Ideal  or  Goal is less than 70  !  )   - Recommend a stricter low cho;les                                                                                             low cholesterol diet   - Cholesterol only comes from animal sources                                                                                                    - ie. meat, dairy, egg yolks  - Eat all the vegetables you want.  - Avoid Meat, Avoid Meat,  Avoid Meat                                                                              - especially Red Meat - Beef AND Pork .  - Avoid cheese & dairy - milk & ice cream.     - Cheese is the most concentrated form of trans-fats which                                                                              is the worst thing to clog up our arteries.    - Veggie cheese is OK which can be found in the fresh  produce section at [facility] or [employer] or [employer]  <>*<>*<>*<>*<>*<>*<>*<>*<>*<>*<>*<>*<>*<>*<>*<>*<>*<>*<>*<>*<>*<>*<>*<>*<> <>*<>*<>*<>*<>*<>*<>*<>*<>*<>*<>*<>*<>*<>*<>*<>*<>*<>*<>*<>*<>*<>*<>*<>*<>  -  A1c - Normal - No   Diabetes  - Great !                                                   <>*<>*<>*<>*<>*<>*<>*<>*<>*<>*<>*<>*<>*<>*<>*<>*<>*<>*<>*<>*<>*<>*<>*<>*<> <>*<>*<>*<>*<>*<>*<>*<>*<>*<>*<>*<>*<>*<>*<>*<>*<>*<>*<>*<>*<>*<>*<>*<>*<>  -  Vitamin D = 80  - Great- Please keep dose same   <>*<>*<>*<>*<>*<>*<>*<>*<>*<>*<>*<>*<>*<>*<>*<>*<>*<>*<>*<>*<>*<>*<>*<>*<> <>*<>*<>*<>*<>*<>*<>*<>*<>*<>*<>*<>*<>*<>*<>*<>*<>*<>*<>*<>*<>*<>*<>*<>*<>  -  All Else - CBC - Kidneys - Electrolytes - Liver - Magnesium & Thyroid    - all  Normal / OK  <>*<>*<>*<>*<>*<>*<>*<>*<>*<>*<>*<>*<>*<>*<>*<>*<>*<>*<>*<>*<>*<>*<>*<>*<> <>*<>*<>*<>*<>*<>*<>*<>*<>*<>*<>*<>*<>*<>*<>*<>*<>*<>*<>*<>*<>*<>*<>*<>*<>

## 2023-04-04 ENCOUNTER — Other Ambulatory Visit: Payer: Self-pay | Admitting: Nurse Practitioner

## 2023-04-04 DIAGNOSIS — F419 Anxiety disorder, unspecified: Secondary | ICD-10-CM

## 2023-04-10 ENCOUNTER — Encounter: Payer: Self-pay | Admitting: Internal Medicine

## 2023-04-11 ENCOUNTER — Ambulatory Visit
Admission: RE | Admit: 2023-04-11 | Discharge: 2023-04-11 | Disposition: A | Discharge status: Home / Self Care | Payer: Medicare Other | Source: Ambulatory Visit | Attending: Family Medicine | Admitting: Family Medicine

## 2023-04-11 VITALS — BP 123/76 | HR 77 | Temp 98.8°F | Ht 68.0 in | Wt 143.0 lb

## 2023-04-11 DIAGNOSIS — R0981 Nasal congestion: Secondary | ICD-10-CM

## 2023-04-11 DIAGNOSIS — Z20822 Contact with and (suspected) exposure to covid-19: Secondary | ICD-10-CM | POA: Insufficient documentation

## 2023-04-11 DIAGNOSIS — J0100 Acute maxillary sinusitis, unspecified: Secondary | ICD-10-CM | POA: Diagnosis not present

## 2023-04-11 DIAGNOSIS — R509 Fever, unspecified: Secondary | ICD-10-CM | POA: Diagnosis present

## 2023-04-11 LAB — RESP PANEL BY RT-PCR (RSV, FLU A&B, COVID)  RVPGX2
Influenza A by PCR: NEGATIVE
Influenza B by PCR: NEGATIVE
Resp Syncytial Virus by PCR: NEGATIVE
SARS Coronavirus 2 by RT PCR: NEGATIVE

## 2023-04-11 MED ORDER — AMOXICILLIN-POT CLAVULANATE 875-125 MG PO TABS: 1.0000 | ORAL_TABLET | Freq: Two times a day (BID) | ORAL | 0 refills | Status: DC

## 2023-04-11 NOTE — Discharge Instructions (Addendum)
I will call you to discuss results.    You can take Tylenol and/or Ibuprofen as needed for fever reduction and pain relief.   For congestion: take a daily anti-histamine like Zyrtec, Claritin, and a oral decongestant, such as pseudoephedrine.  You can also use Flonase 1-2 sprays in each nostril daily. Afrin is also a good option, if you do not have high blood pressure.    It is important to stay hydrated: drink plenty of fluids (water, gatorade/powerade/pedialyte, juices, or teas) to keep your throat moisturized and help further relieve irritation/discomfort.    Return or go to the Emergency Department if symptoms worsen or do not improve in the next few days

## 2023-04-11 NOTE — ED Provider Notes (Signed)
[facility]    CSN: [id number] Arrival date & time: 04/11/23  0813      History   Chief Complaint Chief Complaint  Patient presents with   Nasal Congestion    HPI Briana Charles is a 67 y.o. female.   HPI  History obtained from the patient. Briana Charles presents for nasal congestion, scratchy throat, rhinorrhea, headache and dental pain for the past 4 days.  Has had an elevated temperature but no fever.  Tmax 99.9.  Has been taking over-the-counter medications including Zicam, Aleve and Tylenol without relief.  Has been no cough, vomiting, diarrhea, ear pain or congestion.  She was around her family over Christmas break but no one else seems to be sick.  States she gets sinus infections often.  Does not follow with ENT.     Past Medical History:  Diagnosis Date   Chronic tension headaches    Depression    IBS (irritable bowel syndrome)    Labile hypertension    Vitamin D deficiency     Patient Active Problem List   Diagnosis Date Noted   Anxiety tension state 07/26/2019   Screening examination for pulmonary tuberculosis 07/26/2019   Body mass index (BMI) of 20.0-20.9 in adult 02/13/2015   Abnormal glucose 02/02/2014   Hyperlipidemia, mixed 02/02/2014   Medication management 02/02/2014   IBS (irritable bowel syndrome)    Labile hypertension    Vitamin D deficiency    Depression, controlled    Chronic tension-type headache, not intractable     Past Surgical History:  Procedure Laterality Date   ABDOMINAL HYSTERECTOMY     total hysterectomy   BILATERAL TEMPOROMANDIBULAR JOINT ARTHROPLASTY  1986, 1988   HEMICOLECTOMY  1991   TUBAL LIGATION  1981    OB History   No obstetric history on file.      Home Medications    Prior to Admission medications   Medication Sig Start Date End Date Taking? Authorizing Provider  ALPRAZolam (XANAX) 0.5 MG tablet TAKE 1/2-1 TABLET 2 TO 3 TIMES DAILY(ONLY IF NEEDED FOR ANXIETY ATTACK& LIMIT TO 5 DAYS/WK TO AVOID  ADDICTION& DEMENTIA) 01/29/23  Yes Cranford, Archie Patten, NP  amoxicillin-clavulanate (AUGMENTIN) 875-125 MG tablet Take 1 tablet by mouth every 12 (twelve) hours. 04/11/23  Yes Amunique Neyra, DO  buPROPion (WELLBUTRIN XL) 300 MG 24 hr tablet Take 1 tablet Daily for Mood, Focus & Concentration                                /                                                                   TAKE                                         BY                                                 MOUTH 09/22/22  Yes Lucky Cowboy, MD  butalbital-acetaminophen-caffeine (FIORICET) 50-325-40 MG tablet TAKE 1 TABLET BY MOUTH EVERY 4 HOURS AS NEEDED FOR SEVERE HEADACHE 01/09/23  Yes Cranford, Archie Patten, NP  Cholecalciferol (VITAMIN D PO) Take 5,000 Units by mouth daily.   Yes [provider]  dicyclomine (BENTYL) 20 MG tablet Take  1/2 to 1 tablet   4 x /day  before meals & Bedtime for Nausea, Cramping, Bloating or Diarrhea 09/04/22  Yes Lucky Cowboy, MD  estradiol [facility]) 0.025 mg/24hr patch Apply 1 patch  to Skin  every 7 days 08/08/22  Yes Lucky Cowboy, MD  gabapentin (NEURONTIN) 100 MG capsule Take 1 capsule 3 to 4 x /day if needed for Pain shoulder. 02/26/23  Yes Lucky Cowboy, MD    Family History Family History  Problem Relation Age of Onset   Cancer Mother        lung   Cancer Father        colon   Alzheimer's disease Father     Social History Social History   Tobacco Use   Smoking status: Never   Smokeless tobacco: Never  Vaping Use   Vaping status: Never Used  Substance Use Topics   Alcohol use: Yes    Comment: Social   Drug use: No     Allergies   Ciprofloxacin, Luvox [fluvoxamine], and Sulfa antibiotics   Review of Systems Review of Systems: negative unless otherwise stated in HPI.      Physical Exam Triage Vital Signs ED Triage Vitals  Encounter Vitals Group     BP      Systolic BP Percentile      Diastolic BP Percentile      Pulse      Resp      Temp       Temp src      SpO2      Weight      Height      Head Circumference      Peak Flow      Pain Score      Pain Loc      Pain Education      Exclude from Growth Chart    No data found.  Updated Vital Signs BP 123/76 (BP Location: Left Arm)   Pulse 77   Temp 98.8 F (37.1 C) (Oral)   Ht 5\' 8"  (1.727 m)   Wt 64.9 kg   SpO2 96%   BMI 21.74 kg/m   Visual Acuity Right Eye Distance:   Left Eye Distance:   Bilateral Distance:    Right Eye Near:   Left Eye Near:    Bilateral Near:     Physical Exam GEN:     alert, non-toxic appearing female in no distress    HENT:  mucus membranes moist, oropharyngeal without lesions or erythema, no tonsillar hypertrophy or exudates,  moderate erythematous edematous turbinates, thick nasal discharge, bilateral TM normal, no frontal sinus tenderness, significant maxillary sinus tenderness EYES:   pupils equal and reactive, no scleral injection or discharge NECK:  normal ROM, no meningismus   RESP:  no increased work of breathing, clear to auscultation bilaterally CVS:   regular rate and rhythm Skin:   warm and dry    UC Treatments / Results  Labs (all labs ordered are listed, but only abnormal results are displayed) Labs Reviewed  RESP PANEL BY RT-PCR (RSV, FLU A&B, COVID)  [id number]    EKG   Radiology No results found.  Procedures Procedures (including critical  care time)  Medications Ordered in UC Medications - No data to display  Initial Impression / Assessment and Plan / UC Course  I have reviewed the triage vital signs and the nursing notes.  Pertinent labs & imaging results that were available during my care of the patient were reviewed by me and considered in my medical decision making (see chart for details).       Pt is a 67 y.o. female who presents for 4 days of respiratory symptoms. Briana Charles is afebrile here. Satting well on room air. Overall pt is non-toxic appearing, well hydrated, without respiratory distress.  Pulmonary exam is unremarkable.  COVID, RSV and influenza panel obtained. Pt to quarantine until test results or longer if positive.  I will call patient with test results.  Return and ED precautions given and voiced understanding. Discussed MDM, treatment plan and plan for follow-up with patient who agrees with plan.   COVID, influenza and RSV were negative.  As she has maxillary sinus tenderness, facial pain and headache with low-grade fevers we will treat for bacterial cause of maxillary sinusitis with Augmentin as below.  Patient called and updated with lab results.  Discussed symptomatic treatment and.  typical duration of symptoms.     Final Clinical Impressions(s) / UC Diagnoses   Final diagnoses:  Acute non-recurrent maxillary sinusitis  Encounter for laboratory testing for COVID-19 virus     Discharge Instructions      I will call you to discuss results.    You can take Tylenol and/or Ibuprofen as needed for fever reduction and pain relief.   For congestion: take a daily anti-histamine like Zyrtec, Claritin, and a oral decongestant, such as pseudoephedrine.  You can also use Flonase 1-2 sprays in each nostril daily. Afrin is also a good option, if you do not have high blood pressure.    It is important to stay hydrated: drink plenty of fluids (water, gatorade/powerade/pedialyte, juices, or teas) to keep your throat moisturized and help further relieve irritation/discomfort.    Return or go to the Emergency Department if symptoms worsen or do not improve in the next few days      ED Prescriptions     Medication Sig Dispense Auth. Provider   amoxicillin-clavulanate (AUGMENTIN) 875-125 MG tablet Take 1 tablet by mouth every 12 (twelve) hours. 14 tablet Briana Charles, Seward Meth, DO      PDMP not reviewed this encounter.   Katha Cabal, DO 04/11/23 1009

## 2023-04-11 NOTE — ED Triage Notes (Signed)
Pt c/o headache, nasal congestion, nose bleeds, and dental pain x4days  Pt is worried about a sinus infection

## 2023-04-28 ENCOUNTER — Other Ambulatory Visit: Payer: Self-pay | Admitting: Nurse Practitioner

## 2023-04-28 DIAGNOSIS — G44229 Chronic tension-type headache, not intractable: Secondary | ICD-10-CM

## 2023-05-25 ENCOUNTER — Other Ambulatory Visit: Payer: Self-pay | Admitting: Nurse Practitioner

## 2023-05-25 DIAGNOSIS — F419 Anxiety disorder, unspecified: Secondary | ICD-10-CM

## 2023-05-26 MED ORDER — ALPRAZOLAM 0.5 MG PO TABS: ORAL_TABLET | ORAL | 0 refills | Status: DC

## 2023-05-29 ENCOUNTER — Ambulatory Visit
Admission: RE | Admit: 2023-05-29 | Discharge: 2023-05-29 | Disposition: A | Discharge status: Home / Self Care | Payer: Medicare Other | Source: Ambulatory Visit | Attending: Nurse Practitioner | Admitting: Nurse Practitioner

## 2023-05-29 DIAGNOSIS — M8588 Other specified disorders of bone density and structure, other site: Secondary | ICD-10-CM

## 2023-05-30 ENCOUNTER — Encounter: Payer: Self-pay | Admitting: Family

## 2023-06-03 ENCOUNTER — Ambulatory Visit: Payer: Medicare Other | Admitting: Nurse Practitioner

## 2023-06-05 ENCOUNTER — Other Ambulatory Visit: Payer: Self-pay

## 2023-06-05 DIAGNOSIS — C44719 Basal cell carcinoma of skin of left lower limb, including hip: Secondary | ICD-10-CM | POA: Diagnosis not present

## 2023-06-05 DIAGNOSIS — G44229 Chronic tension-type headache, not intractable: Secondary | ICD-10-CM

## 2023-06-05 DIAGNOSIS — D485 Neoplasm of uncertain behavior of skin: Secondary | ICD-10-CM | POA: Diagnosis not present

## 2023-06-05 MED ORDER — BUTALBITAL-APAP-CAFFEINE 50-325-40 MG PO TABS: ORAL_TABLET | ORAL | 0 refills | Status: DC

## 2023-06-13 ENCOUNTER — Telehealth: Payer: Self-pay | Admitting: Internal Medicine

## 2023-06-13 NOTE — Telephone Encounter (Signed)
 Copied from CRM [id number]. Topic: Appointments - Transfer of Care >> Jun 13, 2023  1:07 PM Tiffany S wrote: Pt is requesting to transfer FROM: Dr Lucky Cowboy  Pt is requesting to transfer TO: Dr Bari Edward Reason for requested transfer: Provider passed away. Looking to get established with new provider  It is the responsibility of the team the patient would like to transfer to Dr Cresenciano Genre) to reach out to the patient if for any reason this transfer is not acceptable.

## 2023-07-04 ENCOUNTER — Other Ambulatory Visit: Payer: Self-pay

## 2023-07-04 DIAGNOSIS — N951 Menopausal and female climacteric states: Secondary | ICD-10-CM

## 2023-07-04 MED ORDER — ESTRADIOL 0.025 MG/24HR TD PTWK: MEDICATED_PATCH | TRANSDERMAL | 3 refills | Status: DC

## 2023-08-06 ENCOUNTER — Ambulatory Visit: Payer: Medicare Other | Admitting: Student

## 2023-08-06 ENCOUNTER — Other Ambulatory Visit: Payer: Self-pay | Admitting: *Deleted

## 2023-08-06 ENCOUNTER — Encounter: Payer: Self-pay | Admitting: Student

## 2023-08-06 VITALS — BP 100/72 | HR 67 | Ht 68.0 in | Wt 143.6 lb

## 2023-08-06 DIAGNOSIS — Z1231 Encounter for screening mammogram for malignant neoplasm of breast: Secondary | ICD-10-CM | POA: Diagnosis not present

## 2023-08-06 DIAGNOSIS — Z0000 Encounter for general adult medical examination without abnormal findings: Secondary | ICD-10-CM

## 2023-08-06 DIAGNOSIS — M8580 Other specified disorders of bone density and structure, unspecified site: Secondary | ICD-10-CM | POA: Insufficient documentation

## 2023-08-06 DIAGNOSIS — E782 Mixed hyperlipidemia: Secondary | ICD-10-CM | POA: Diagnosis not present

## 2023-08-06 DIAGNOSIS — N393 Stress incontinence (female) (male): Secondary | ICD-10-CM

## 2023-08-06 DIAGNOSIS — Z90710 Acquired absence of both cervix and uterus: Secondary | ICD-10-CM | POA: Insufficient documentation

## 2023-08-06 DIAGNOSIS — N958 Other specified menopausal and perimenopausal disorders: Secondary | ICD-10-CM

## 2023-08-06 DIAGNOSIS — M85851 Other specified disorders of bone density and structure, right thigh: Secondary | ICD-10-CM | POA: Diagnosis not present

## 2023-08-06 DIAGNOSIS — G44229 Chronic tension-type headache, not intractable: Secondary | ICD-10-CM

## 2023-08-06 DIAGNOSIS — F411 Generalized anxiety disorder: Secondary | ICD-10-CM

## 2023-08-06 DIAGNOSIS — F32A Depression, unspecified: Secondary | ICD-10-CM

## 2023-08-06 DIAGNOSIS — N951 Menopausal and female climacteric states: Secondary | ICD-10-CM

## 2023-08-06 DIAGNOSIS — K580 Irritable bowel syndrome with diarrhea: Secondary | ICD-10-CM

## 2023-08-06 MED ORDER — ESTRADIOL 0.1 MG/24HR TD PTWK: 0.1000 mg | MEDICATED_PATCH | TRANSDERMAL | 3 refills | Status: DC

## 2023-08-06 MED ORDER — ESTRADIOL 0.1 MG/24HR TD PTWK: 0.1000 mg | MEDICATED_PATCH | TRANSDERMAL | 3 refills | Status: AC

## 2023-08-06 NOTE — Patient Instructions (Addendum)
 Please call to schedule your mammogram [phone]

## 2023-08-06 NOTE — Assessment & Plan Note (Signed)
 DEXA 05/29/2023 with osteopenia, currently on vitamin D  supplementation, check CMP and vitamin D .

## 2023-08-06 NOTE — Assessment & Plan Note (Signed)
 Continue estrogen 0.1mg  weekly

## 2023-08-06 NOTE — Assessment & Plan Note (Addendum)
 On wellbutrin  and xannax 0.25 mg as needed. Takes xannax about once a day. Reports she has tried multiple medications in the past and discontinued due to side effects. Denies falls or memory changes. She is retired and living with husband, repots she is not used to having anther person at home. Discussed therapy, would like to come down on the amount of xannax she is taking. She will consider this.

## 2023-08-06 NOTE — Assessment & Plan Note (Addendum)
 Medications are bentyl . Working on keep log to assess for triggers.

## 2023-08-06 NOTE — Assessment & Plan Note (Signed)
 Well controlled on wellbutrin 300mg  daily.

## 2023-08-06 NOTE — Telephone Encounter (Signed)
 Please clarify medication dose and when to change patch, every 24 hours or every 7 days. Please advise and contact [facility]  pharmacy    Copied from CRM [id number]. Topic: Clinical - Prescription Issue >> Aug 06, 2023  9:11 AM Jyl Or S wrote: Reason for CRM: estradiol  (CLIMARA  - DOSED IN MG/24 HR) 0.1 mg/24hr patch [[id number]]  Christian from [facility] needs clarification on medication  [phone]

## 2023-08-06 NOTE — Assessment & Plan Note (Addendum)
 Using fiorcet about 3 times weekly. Stress is a major trigger.

## 2023-08-06 NOTE — Progress Notes (Signed)
 New Patient Office Visit  Subjective    Patient ID: Briana Charles, female    DOB: Feb 28, 1956  Age: 68 y.o. MRN: [id number]  CC:  Chief Complaint  Patient presents with   Establish Care    Patient presents today to establish care and does not have any concerns for today's visit. She is feeling okay today.    HPI Briana Charles is a 68 year old person who presents to establish care. Was seeing Dr. Vangie Genet but PCP passed away unexpectedly.   She is having issues with stress incontinence. Notes this was originally when running on the treadmill,. Now happening more frequently when she stand to go to the restroom, sneezes, or coughs.  This has been slowly progressing for the past several years. Some urgency symptoms as well. Denis dysuria, incomplete voiding, hematuria.  Outpatient Encounter Medications as of 08/06/2023  Medication Sig   ALPRAZolam  (XANAX ) 0.5 MG tablet TAKE 1/2-1 TABLET 2 TO 3 TIMES DAILY(ONLY IF NEEDED FOR ANXIETY ATTACK& LIMIT TO 5 DAYS/WK TO AVOID ADDICTION& DEMENTIA)   buPROPion  (WELLBUTRIN  XL) 300 MG 24 hr tablet Take 1 tablet Daily for Mood, Focus & Concentration                                /                                                                   TAKE                                         BY                                                 MOUTH   butalbital -acetaminophen-caffeine  (FIORICET) 50-325-40 MG tablet TAKE 1 TABLET BY MOUTH EVERY 4 HOURS AS NEEDED FOR SEVERE HEADACHE   Cholecalciferol (VITAMIN D  PO) Take 5,000 Units by mouth daily.   dicyclomine  (BENTYL ) 20 MG tablet Take  1/2 to 1 tablet   4 x /day  before meals & Bedtime for Nausea, Cramping, Bloating or Diarrhea   [DISCONTINUED] estradiol  (CLIMARA ) 0.025 mg/24hr patch Apply 1 patch  to Skin  every 7 days   estradiol  (CLIMARA  - DOSED IN MG/24 HR) 0.1 mg/24hr patch Place 1 patch (0.1 mg total) onto the skin once a week. Apply 1 patch  to Skin  every 7 days   [DISCONTINUED]  amoxicillin -clavulanate (AUGMENTIN ) 875-125 MG tablet Take 1 tablet by mouth every 12 (twelve) hours.   [DISCONTINUED] estradiol  (CLIMARA  - DOSED IN MG/24 HR) 0.1 mg/24hr patch Place 1 patch (0.1 mg total) onto the skin 2 (two) times a week. Apply 1 patch  to Skin  every 7 days   [DISCONTINUED] estradiol  (CLIMARA  - DOSED IN MG/24 HR) 0.1 mg/24hr patch Place 1 patch (0.1 mg total) onto the skin 2 (two) times a week. Apply 1 patch  to Skin  every 7 days   [DISCONTINUED] gabapentin  (  NEURONTIN ) 100 MG capsule Take 1 capsule 3 to 4 x /day if needed for Pain shoulder. (Patient not taking: Reported on 08/06/2023)   No facility-administered encounter medications on file as of 08/06/2023.    Past Medical History:  Diagnosis Date   Chronic tension headaches    Depression    IBS (irritable bowel syndrome)    Labile hypertension    Vitamin D  deficiency     Past Surgical History:  Procedure Laterality Date   ABDOMINAL HYSTERECTOMY     total hysterectomy   BILATERAL TEMPOROMANDIBULAR JOINT ARTHROPLASTY  1986, 1988   HEMICOLECTOMY  1991   TUBAL LIGATION  1981    Family History  Problem Relation Age of Onset   Cancer Mother        lung   Cancer Father        colon   Alzheimer's disease Father     Social History   Socioeconomic History   Marital status: Married    Spouse name: Not on file   Number of children: Not on file   Years of education: Not on file   Highest education level: 12th grade  Occupational History   Not on file  Tobacco Use   Smoking status: Never   Smokeless tobacco: Never  Vaping Use   Vaping status: Never Used  Substance and Sexual Activity   Alcohol use: Yes    Comment: Social   Drug use: No   Sexual activity: Not on file  Other Topics Concern   Not on file  Social History Narrative   Not on file   Social Drivers of Health   Financial Resource Strain: Low Risk  (07/30/2023)   Overall Financial Resource Strain (CARDIA)    Difficulty of Paying Living  Expenses: Not hard at all  Food Insecurity: No Food Insecurity (07/30/2023)   Hunger Vital Sign    Worried About Running Out of Food in the Last Year: Never true    Ran Out of Food in the Last Year: Never true  Transportation Needs: No Transportation Needs (07/30/2023)   PRAPARE - Administrator, Civil Service (Medical): No    Lack of Transportation (Non-Medical): No  Physical Activity: Insufficiently Active (07/30/2023)   Exercise Vital Sign    Days of Exercise per Week: 3 days    Minutes of Exercise per Session: 40 min  Stress: No Stress Concern Present (07/30/2023)   [employer] of Occupational Health - Occupational Stress Questionnaire    Feeling of Stress : Only a little  Social Connections: Moderately Isolated (07/30/2023)   Social Connection and Isolation Panel [NHANES]    Frequency of Communication with Friends and Family: Once a week    Frequency of Social Gatherings with Friends and Family: Once a week    Attends Religious Services: More than 4 times per year    Active Member of [employer] or Organizations: No    Attends Engineer, structural: Not on file    Marital Status: Married  Catering manager Violence: Not on file    ROS Refer to HPI    Objective   BP 100/72   Pulse 67   Ht 5\' 8"  (1.727 m)   Wt 143 lb 9.6 oz (65.1 kg)   SpO2 98%   BMI 21.83 kg/m   Physical Exam Constitutional:      Appearance: Normal appearance.  HENT:     Mouth/Throat:     Mouth: Mucous membranes are moist.     Pharynx: Oropharynx  is clear.  Cardiovascular:     Rate and Rhythm: Normal rate and regular rhythm.  Pulmonary:     Effort: Pulmonary effort is normal.     Breath sounds: No rhonchi or rales.  Abdominal:     General: Abdomen is flat. Bowel sounds are normal. There is no distension.     Palpations: Abdomen is soft.     Tenderness: There is no abdominal tenderness.  Musculoskeletal:        General: Normal range of motion.     Right lower leg: No edema.      Left lower leg: No edema.  Skin:    General: Skin is warm and dry.     Capillary Refill: Capillary refill takes less than 2 seconds.  Neurological:     General: No focal deficit present.     Mental Status: She is alert and oriented to person, place, and time.  Psychiatric:        Mood and Affect: Mood normal.        Behavior: Behavior normal.        08/06/2023    7:59 AM 02/26/2023   11:02 PM 10/23/2022   11:28 AM  Depression screen PHQ 2/9  Decreased Interest 1 0 0  Down, Depressed, Hopeless 0 0 0  PHQ - 2 Score 1 0 0  Altered sleeping 0    Tired, decreased energy 0    Change in appetite 1    Feeling bad or failure about yourself  0    Trouble concentrating 1    Moving slowly or fidgety/restless 1    Suicidal thoughts 0    PHQ-9 Score 4    Difficult doing work/chores Not difficult at all        08/06/2023    8:00 AM  GAD 7 : Generalized Anxiety Score  Nervous, Anxious, on Edge 1  Control/stop worrying 0  Worry too much - different things 1  Trouble relaxing 1  Restless 1  Easily annoyed or irritable 0  Afraid - awful might happen 0  Total GAD 7 Score 4  Anxiety Difficulty Not difficult at all        Assessment & Plan:  Annual physical exam Assessment & Plan: Pneumonia vaccine administered today.  Mammogram due in August, future order placed Labwork today  Orders: -     CBC with Differential/Platelet -     Comprehensive metabolic panel with GFR -     Pneumococcal conjugate vaccine 20-valent  Encounter for screening mammogram for malignant neoplasm of breast -     3D Screening Mammogram, Left and Right; Future  Stress incontinence Assessment & Plan: She is on long term xannax and transdermal estrogen for vaginal atrophy. Discussed kegels and bladder incontinence.    Hyperlipidemia, mixed Assessment & Plan: Has been working on dietary changes. Repeat lipid panel today.   Orders: -     Lipid panel  Osteopenia of neck of right femur Assessment  & Plan: DEXA 05/29/2023 with osteopenia, currently on vitamin D  supplementation, check CMP and vitamin D .  Orders: -     VITAMIN D  25 Hydroxy (Vit-D Deficiency, Fractures)  Genitourinary syndrome of menopause Assessment & Plan: Continue estrogen 0.1mg  weekly  Orders: -     Estradiol ; Place 1 patch (0.1 mg total) onto the skin once a week. Apply 1 patch  to Skin  every 7 days  Dispense: 12 patch; Refill: 3  Depression, controlled Assessment & Plan: Well controlled on wellbutrin  300 mg daily  GAD (generalized anxiety disorder) Assessment & Plan: On wellbutrin  and xannax 0.25 mg as needed. Takes xannax about once a day. Reports she has tried multiple medications in the past and discontinued due to side effects. Denies falls or memory changes. She is retired and living with husband, repots she is not used to having anther person at home. Discussed therapy, would like to come down on the amount of xannax she is taking. She will consider this.    Chronic tension-type headache, not intractable Assessment & Plan: Using fiorcet about 3 times weekly. Stress is a major trigger.    Irritable bowel syndrome with diarrhea Assessment & Plan: Medications are bentyl . Working on keep log to assess for triggers.      Return in about 6 months (around 02/05/2024) for depression, anxiety .   Barnetta Liberty, MD

## 2023-08-06 NOTE — Assessment & Plan Note (Signed)
 Pneumonia vaccine administered today.  Mammogram due in August, future order placed Labwork today

## 2023-08-06 NOTE — Assessment & Plan Note (Signed)
 Has been working on dietary changes. Repeat lipid panel today.

## 2023-08-06 NOTE — Assessment & Plan Note (Addendum)
 She is on long term xannax and transdermal estrogen for vaginal atrophy. Discussed kegels and bladder incontinence.

## 2023-08-06 NOTE — Telephone Encounter (Signed)
 Please read message about from the pharmacy.  KP

## 2023-08-07 ENCOUNTER — Encounter: Payer: Self-pay | Admitting: Student

## 2023-08-07 LAB — COMPREHENSIVE METABOLIC PANEL WITH GFR
ALT: 14 IU/L (ref 0–32)
AST: 21 IU/L (ref 0–40)
Albumin: 4.4 g/dL (ref 3.9–4.9)
Alkaline Phosphatase: 51 IU/L (ref 44–121)
BUN/Creatinine Ratio: 19 (ref 12–28)
BUN: 18 mg/dL (ref 8–27)
Bilirubin Total: 0.2 mg/dL (ref 0.0–1.2)
CO2: 26 mmol/L (ref 20–29)
Calcium: 9.4 mg/dL (ref 8.7–10.3)
Chloride: 101 mmol/L (ref 96–106)
Creatinine, Ser: 0.95 mg/dL (ref 0.57–1.00)
Globulin, Total: 2 g/dL (ref 1.5–4.5)
Glucose: 88 mg/dL (ref 70–99)
Potassium: 5.4 mmol/L — ABNORMAL HIGH (ref 3.5–5.2)
Sodium: 140 mmol/L (ref 134–144)
Total Protein: 6.4 g/dL (ref 6.0–8.5)
eGFR: 65 mL/min/{1.73_m2} (ref >59)

## 2023-08-07 LAB — CBC WITH DIFFERENTIAL/PLATELET
Basophils Absolute: 0.1 10*3/uL (ref 0.0–0.2)
Basos: 1 %
EOS (ABSOLUTE): 0.2 10*3/uL (ref 0.0–0.4)
Eos: 2 %
Hematocrit: 41 % (ref 34.0–46.6)
Hemoglobin: 13.5 g/dL (ref 11.1–15.9)
Immature Grans (Abs): 0 10*3/uL (ref 0.0–0.1)
Immature Granulocytes: 0 %
Lymphocytes Absolute: 1.4 10*3/uL (ref 0.7–3.1)
Lymphs: 18 %
MCH: 29.7 pg (ref 26.6–33.0)
MCHC: 32.9 g/dL (ref 31.5–35.7)
MCV: 90 fL (ref 79–97)
Monocytes Absolute: 0.5 10*3/uL (ref 0.1–0.9)
Monocytes: 7 %
Neutrophils Absolute: 5.3 10*3/uL (ref 1.4–7.0)
Neutrophils: 72 %
Platelets: 242 10*3/uL (ref 150–450)
RBC: 4.55 x10E6/uL (ref 3.77–5.28)
RDW: 12.5 % (ref 11.7–15.4)
WBC: 7.5 10*3/uL (ref 3.4–10.8)

## 2023-08-07 LAB — LIPID PANEL
Chol/HDL Ratio: 2.7 ratio (ref 0.0–4.4)
Cholesterol, Total: 221 mg/dL — ABNORMAL HIGH (ref 100–199)
HDL: 81 mg/dL (ref >39)
LDL Chol Calc (NIH): 126 mg/dL — ABNORMAL HIGH (ref 0–99)
Triglycerides: 79 mg/dL (ref 0–149)
VLDL Cholesterol Cal: 14 mg/dL (ref 5–40)

## 2023-08-07 LAB — VITAMIN D 25 HYDROXY (VIT D DEFICIENCY, FRACTURES): Vit D, 25-Hydroxy: 64.4 ng/mL (ref 30.0–100.0)

## 2023-08-12 NOTE — Addendum Note (Signed)
 Addended by: Barnetta Liberty on: 08/12/2023 10:13 AM   Modules accepted: Level of Service

## 2023-08-12 NOTE — Addendum Note (Signed)
 Addended by: Barnetta Liberty on: 08/12/2023 10:15 AM   Modules accepted: Level of Service

## 2023-08-13 ENCOUNTER — Other Ambulatory Visit: Payer: Self-pay | Admitting: Student

## 2023-08-13 DIAGNOSIS — E875 Hyperkalemia: Secondary | ICD-10-CM

## 2023-08-14 ENCOUNTER — Encounter: Payer: Self-pay | Admitting: Student

## 2023-08-14 ENCOUNTER — Other Ambulatory Visit: Payer: Self-pay | Admitting: Student

## 2023-08-14 DIAGNOSIS — E875 Hyperkalemia: Secondary | ICD-10-CM | POA: Insufficient documentation

## 2023-08-14 LAB — BASIC METABOLIC PANEL WITH GFR
BUN/Creatinine Ratio: 25 (ref 12–28)
BUN: 21 mg/dL (ref 8–27)
CO2: 25 mmol/L (ref 20–29)
Calcium: 9.5 mg/dL (ref 8.7–10.3)
Chloride: 102 mmol/L (ref 96–106)
Creatinine, Ser: 0.84 mg/dL (ref 0.57–1.00)
Glucose: 86 mg/dL (ref 70–99)
Potassium: 5.5 mmol/L — ABNORMAL HIGH (ref 3.5–5.2)
Sodium: 139 mmol/L (ref 134–144)
eGFR: 76 mL/min/{1.73_m2} (ref >59)

## 2023-08-14 NOTE — Telephone Encounter (Signed)
 Please review.  KP

## 2023-08-18 ENCOUNTER — Encounter: Payer: Medicare Other | Admitting: Internal Medicine

## 2023-09-02 ENCOUNTER — Encounter: Payer: Self-pay | Admitting: Student

## 2023-09-02 NOTE — Telephone Encounter (Signed)
 Please review and advise.

## 2023-09-04 ENCOUNTER — Other Ambulatory Visit: Payer: Self-pay | Admitting: Student

## 2023-09-04 DIAGNOSIS — F419 Anxiety disorder, unspecified: Secondary | ICD-10-CM

## 2023-09-04 MED ORDER — ALPRAZOLAM 0.5 MG PO TABS: ORAL_TABLET | ORAL | 0 refills | Status: DC

## 2023-09-16 DIAGNOSIS — L578 Other skin changes due to chronic exposure to nonionizing radiation: Secondary | ICD-10-CM | POA: Diagnosis not present

## 2023-09-16 DIAGNOSIS — Z872 Personal history of diseases of the skin and subcutaneous tissue: Secondary | ICD-10-CM | POA: Diagnosis not present

## 2023-09-16 DIAGNOSIS — Z85828 Personal history of other malignant neoplasm of skin: Secondary | ICD-10-CM | POA: Diagnosis not present

## 2023-09-17 ENCOUNTER — Encounter: Payer: Self-pay | Admitting: Student

## 2023-09-17 DIAGNOSIS — F419 Anxiety disorder, unspecified: Secondary | ICD-10-CM

## 2023-09-17 DIAGNOSIS — F32A Depression, unspecified: Secondary | ICD-10-CM

## 2023-09-17 MED ORDER — BUPROPION HCL ER (XL) 300 MG PO TB24: ORAL_TABLET | ORAL | 3 refills | Status: AC

## 2023-10-21 ENCOUNTER — Other Ambulatory Visit: Payer: Self-pay | Admitting: Student

## 2023-10-21 DIAGNOSIS — Z1231 Encounter for screening mammogram for malignant neoplasm of breast: Secondary | ICD-10-CM

## 2023-10-22 ENCOUNTER — Ambulatory Visit

## 2023-10-23 ENCOUNTER — Ambulatory Visit: Payer: Medicare Other | Admitting: Nurse Practitioner

## 2023-10-27 ENCOUNTER — Other Ambulatory Visit: Payer: Self-pay | Admitting: Family

## 2023-10-27 DIAGNOSIS — G44229 Chronic tension-type headache, not intractable: Secondary | ICD-10-CM

## 2023-10-29 ENCOUNTER — Ambulatory Visit: Admitting: Emergency Medicine

## 2023-10-29 VITALS — Ht 68.0 in | Wt 142.0 lb

## 2023-10-29 DIAGNOSIS — Z0000 Encounter for general adult medical examination without abnormal findings: Secondary | ICD-10-CM | POA: Diagnosis not present

## 2023-10-29 NOTE — Progress Notes (Signed)
 Subjective:   Briana Charles is a 68 y.o. who presents for a Medicare Wellness preventive visit.  As a reminder, Annual Wellness Visits don't include a physical exam, and some assessments may be limited, especially if this visit is performed virtually. We may recommend an in-person follow-up visit with your provider if needed.  Visit Complete: Virtual I connected with  Briana Charles on 10/29/23 by a audio enabled telemedicine application and verified that I am speaking with the correct person using two identifiers.  Patient Location: Home  Provider Location: Home Office  I discussed the limitations of evaluation and management by telemedicine. The patient expressed understanding and agreed to proceed.  Vital Signs: Because this visit was a virtual/telehealth visit, some criteria may be missing or patient reported. Any vitals not documented were not able to be obtained and vitals that have been documented are patient reported.  VideoDeclined- This patient declined Librarian, academic. Therefore the visit was completed with audio only.  Persons Participating in Visit: Patient.  AWV Questionnaire: Yes: Patient Medicare AWV questionnaire was completed by the patient on 10/25/23; I have confirmed that all information answered by patient is correct and no changes since this date.  Cardiac Risk Factors include: advanced age (>31men, >65 women);dyslipidemia     Objective:    Today's Vitals   10/29/23 0813  Weight: 142 lb (64.4 kg)  Height: 5' 8 (1.727 m)   Body mass index is 21.59 kg/m.     10/29/2023    8:26 AM 04/11/2023    8:47 AM  Advanced Directives  Does Patient Have a Medical Advance Directive? Yes No  Type of Estate agent of [place];Living will   Does patient want to make changes to medical advance directive? No - Patient declined   Copy of Healthcare Power of Attorney in Chart? No - copy requested     Current  Medications (verified) Outpatient Encounter Medications as of 10/29/2023  Medication Sig   ALPRAZolam  (XANAX ) 0.5 MG tablet TAKE 1/2-1 TABLET 2 TO 3 TIMES DAILY(ONLY IF NEEDED FOR ANXIETY ATTACK& LIMIT TO 5 DAYS/WK TO AVOID ADDICTION& DEMENTIA)   aspirin EC 81 MG tablet Take 81 mg by mouth daily. Swallow whole.   buPROPion  (WELLBUTRIN  XL) 300 MG 24 hr tablet Take 1 tablet Daily for Mood, Focus & Concentration                                /                                                                   TAKE                                         BY                                                 MOUTH   butalbital -acetaminophen-caffeine  (FIORICET) 50-325-40 MG tablet TAKE 1 TABLET  BY MOUTH EVERY 4 HOURS AS NEEDED FOR SEVERE HEADACHE   CALCIUM-VITAMIN D  PO Take 1 tablet by mouth daily.   Cholecalciferol (VITAMIN D  PO) Take 5,000 Units by mouth daily.   dicyclomine  (BENTYL ) 20 MG tablet Take  1/2 to 1 tablet   4 x /day  before meals & Bedtime for Nausea, Cramping, Bloating or Diarrhea (Patient taking differently: Take  1/2 to 1 tablet   4 x /day  before meals & Bedtime for Nausea, Cramping, Bloating or Diarrhea prn)   estradiol  (CLIMARA  - DOSED IN MG/24 HR) 0.1 mg/24hr patch Place 1 patch (0.1 mg total) onto the skin once a week. Apply 1 patch  to Skin  every 7 days   magnesium gluconate (MAGONATE) 500 MG tablet Take 500 mg by mouth daily.   No facility-administered encounter medications on file as of 10/29/2023.    Allergies (verified) Ciprofloxacin , Luvox [fluvoxamine], and Sulfa antibiotics   History: Past Medical History:  Diagnosis Date   Allergy 1975   Sulfa & Cipro    Anxiety 2020   Arthritis 2020   Hands/thumbs (both)   Cataract 2023   Slightly   Chronic tension headaches    Depression    IBS (irritable bowel syndrome)    Labile hypertension    Osteoporosis 2020   osteopenia   Vitamin D  deficiency    Past Surgical History:  Procedure Laterality Date   ABDOMINAL  HYSTERECTOMY     total hysterectomy   APPENDECTOMY  1991   BILATERAL TEMPOROMANDIBULAR JOINT ARTHROPLASTY  1986, 1988   COLON SURGERY  1991   HEMICOLECTOMY  04/15/1989   TUBAL LIGATION  04/16/1979   Family History  Problem Relation Age of Onset   Cancer Mother        lung   Cancer Father        colon   Alzheimer's disease Father    Arthritis Father    Arthritis Sister    Depression Sister    Arthritis Sister    Depression Sister    Social History   Socioeconomic History   Marital status: Married    Spouse name: Darina   Number of children: 2   Years of education: Not on file   Highest education level: 12th grade  Occupational History   Occupation: retired  Tobacco Use   Smoking status: Never   Smokeless tobacco: Never  Vaping Use   Vaping status: Never Used  Substance and Sexual Activity   Alcohol use: Yes    Alcohol/week: 2.0 standard drinks of alcohol    Types: 2 Glasses of wine per week    Comment: 2 glasses of wine once a week (Saturday)   Drug use: No   Sexual activity: Yes    Birth control/protection: Post-menopausal  Other Topics Concern   Not on file  Social History Narrative   Not on file   Social Drivers of Health   Financial Resource Strain: Low Risk  (10/29/2023)   Overall Financial Resource Strain (CARDIA)    Difficulty of Paying Living Expenses: Not hard at all  Food Insecurity: No Food Insecurity (10/29/2023)   Hunger Vital Sign    Worried About Running Out of Food in the Last Year: Never true    Ran Out of Food in the Last Year: Never true  Transportation Needs: No Transportation Needs (10/29/2023)   PRAPARE - Administrator, Civil Service (Medical): No    Lack of Transportation (Non-Medical): No  Physical Activity: Sufficiently Active (10/29/2023)  Exercise Vital Sign    Days of Exercise per Week: 5 days    Minutes of Exercise per Session: 40 min  Stress: No Stress Concern Present (10/29/2023)   [employer] of  Occupational Health - Occupational Stress Questionnaire    Feeling of Stress: Not at all  Social Connections: Moderately Integrated (10/29/2023)   Social Connection and Isolation Panel    Frequency of Communication with Friends and Family: Twice a week    Frequency of Social Gatherings with Friends and Family: Once a week    Attends Religious Services: More than 4 times per year    Active Member of [employer] or Organizations: No    Attends Banker Meetings: Never    Marital Status: Married  Recent Concern: Social Connections - Moderately Isolated (10/18/2023)   Social Connection and Isolation Panel    Frequency of Communication with Friends and Family: Twice a week    Frequency of Social Gatherings with Friends and Family: Once a week    Attends Religious Services: More than 4 times per year    Active Member of [employer] or Organizations: No    Attends Banker Meetings: Not on file    Marital Status: Widowed    Tobacco Counseling Counseling given: Not Answered    Clinical Intake:  Pre-visit preparation completed: Yes  Pain : No/denies pain     BMI - recorded: 21.59 Nutritional Status: BMI of 19-24  Normal Nutritional Risks: None Diabetes: No  Lab Results  Component Value Date   HGBA1C 5.4 02/26/2023   HGBA1C 5.6 08/07/2022   HGBA1C 5.3 08/06/2021     How often do you need to have someone help you when you read instructions, pamphlets, or other written materials from your doctor or pharmacy?: 1 - Never  Interpreter Needed?: No  Information entered by :: Vina Ned, CMA   Activities of Daily Living     10/29/2023    8:15 AM 10/25/2023    8:45 AM  In your present state of health, do you have any difficulty performing the following activities:  Hearing? 0 0  Vision? 0 0  Difficulty concentrating or making decisions? 0 0  Walking or climbing stairs? 0 0  Dressing or bathing? 0 0  Doing errands, shopping? 0 0  Preparing Food and eating ? N N   Using the Toilet? N N  In the past six months, have you accidently leaked urine? Y Y  Comment wears panty liner or pad   Do you have problems with loss of bowel control? N N  Managing your Medications? N N  Managing your Finances? N N  Housekeeping or managing your Housekeeping? N N    Patient Care Team: Lemon Raisin, MD as PCP - [facility] ([facility], [facility]) Sharrie Prentice PARAS, DO as Consulting Physician (Sports Medicine)  I have updated your Care Teams any recent Medical Services you may have received from other providers in the past year.     Assessment:   This is a routine wellness examination for [place].  Hearing/Vision screen Hearing Screening - Comments:: Denies hearing loss  Vision Screening - Comments:: Gets routine eye exams, [facility], [place]   Goals Addressed             This Visit's Progress    Patient Stated       Lose 5 lbs and stay healthy       Depression Screen     10/29/2023  8:24 AM 08/06/2023    7:59 AM 02/26/2023   11:02 PM 10/23/2022   11:28 AM 08/01/2020   10:19 PM 06/23/2018   11:58 PM 05/25/2017    4:11 PM  PHQ 2/9 Scores  PHQ - 2 Score 0 1 0 0 0 0 0  PHQ- 9 Score 1 4         Fall Risk     10/29/2023    8:28 AM 10/25/2023    8:45 AM 10/18/2023   10:19 AM 08/06/2023    7:59 AM 02/26/2023   11:02 PM  Fall Risk   Falls in the past year? 0 0 0 0 0  Number falls in past yr: 0 0 0 0   Injury with Fall? 0 0 0 0   Risk for fall due to : No Fall Risks   No Fall Risks No Fall Risks  Follow up Falls evaluation completed   Falls evaluation completed Falls prevention discussed;Education provided;Falls evaluation completed    MEDICARE RISK AT HOME:  Medicare Risk at Home Any stairs in or around the home?: Yes (3 steps) If so, are there any without handrails?: Yes Home free of loose throw rugs in walkways, pet beds, electrical cords, etc?: Yes Adequate lighting in your home to reduce risk of  falls?: Yes Life alert?: No Use of a cane, walker or w/c?: No Grab bars in the bathroom?: No Shower chair or bench in shower?: Yes Elevated toilet seat or a handicapped toilet?: No  TIMED UP AND GO:  Was the test performed?  No  Cognitive Function: 6CIT completed        10/29/2023    8:29 AM  6CIT Screen  What Year? 0 points  What month? 0 points  What time? 0 points  Count back from 20 0 points  Months in reverse 0 points  Repeat phrase 0 points  Total Score 0 points    Immunizations Immunization History  Administered Date(s) Administered   Influenza Split 02/02/2014, 02/13/2015   Influenza,inj,Quad PF,6+ Mos 01/12/2019   Influenza,inj,quad, With Preservative 03/19/2016   Influenza-Unspecified 01/13/2013, 01/12/2019, 01/14/2020, 01/21/2020, 02/25/2022   Moderna Sars-Covid-2 Vaccination 06/22/2019, 07/20/2019   PNEUMOCOCCAL CONJUGATE-20 08/06/2023   PPD Test 02/02/2014, 02/13/2015, 03/19/2016, 05/23/2017, 06/24/2018, 07/26/2019   Pneumococcal-Unspecified 01/27/2012   Tdap 01/10/2010, 08/13/2018, 09/02/2018   Zoster Recombinant(Shingrix) 12/15/2019   Zoster, Live 08/13/2019    Screening Tests Health Maintenance  Topic Date Due   COVID-19 Vaccine (3 - Moderna risk series) 08/17/2019   Zoster Vaccines- Shingrix (2 of 2) 02/09/2020   INFLUENZA VACCINE  11/14/2023   Medicare Annual Wellness (AWV)  10/28/2024   MAMMOGRAM  12/04/2024   Colonoscopy  08/02/2026   DEXA SCAN  05/28/2028   DTaP/Tdap/Td (4 - Td or Tdap) 09/01/2028   Pneumococcal Vaccine: 50+ Years  Completed   Hepatitis C Screening  Completed   Hepatitis B Vaccines  Aged Out   HPV VACCINES  Aged Out   Meningococcal B Vaccine  Aged Out    Health Maintenance  Health Maintenance Due  Topic Date Due   COVID-19 Vaccine (3 - Moderna risk series) 08/17/2019   Zoster Vaccines- Shingrix (2 of 2) 02/09/2020   Health Maintenance Items Addressed: See Nurse Notes at the end of this note  Additional  Screening:  Vision Screening: Recommended annual ophthalmology exams for early detection of glaucoma and other disorders of the eye. Would you like a referral to an eye doctor? No    Dental Screening: Recommended annual dental exams  for proper oral hygiene  Community Resource Referral / Chronic Care Management: CRR required this visit?  No   CCM required this visit?  No   Plan:    I have personally reviewed and noted the following in the patient's chart:   Medical and social history Use of alcohol, tobacco or illicit drugs  Current medications and supplements including opioid prescriptions. Patient is currently taking opioid prescriptions. Information provided to patient regarding non-opioid alternatives. Patient advised to discuss non-opioid treatment plan with their provider. Functional ability and status Nutritional status Physical activity Advanced directives List of other physicians Hospitalizations, surgeries, and ER visits in previous 12 months Vitals Screenings to include cognitive, depression, and falls Referrals and appointments  In addition, I have reviewed and discussed with patient certain preventive protocols, quality metrics, and best practice recommendations. A written personalized care plan for preventive services as well as general preventive health recommendations were provided to patient.   Vina Ned, CMA   10/29/2023   After Visit Summary: (MyChart) Due to this being a telephonic visit, the after visit summary with patients personalized plan was offered to patient via MyChart   Notes:  MMG scheduled for 12/09/23

## 2023-10-29 NOTE — Patient Instructions (Signed)
 Briana Charles , Thank you for taking time out of your busy schedule to complete your Annual Wellness Visit with me. I enjoyed our conversation and look forward to speaking with you again next year. I, as well as your care team,  appreciate your ongoing commitment to your health goals. Please review the following plan we discussed and let me know if I can assist you in the future. Your Game plan/ To Do List    Referrals: None  Follow up Visits: Next Medicare AWV with our clinical staff: 11/03/24 @ 8:10am (Video Visit)   Have you seen your provider in the last 6 months (3 months if uncontrolled diabetes)? Yes Next Office Visit with your provider: 01/20/24 @ 8:00am with Dr. Lemon  Clinician Recommendations:  Aim for 30 minutes of exercise or brisk walking, 6-8 glasses of water, and 5 servings of fruits and vegetables each day.       This is a list of the screening recommended for you and due dates:  Health Maintenance  Topic Date Due   COVID-19 Vaccine (3 - Moderna risk series) 08/17/2019   Zoster (Shingles) Vaccine (2 of 2) 02/09/2020   Flu Shot  11/14/2023   Medicare Annual Wellness Visit  10/28/2024   Mammogram  12/04/2024   Colon Cancer Screening  08/02/2026   DEXA scan (bone density measurement)  05/28/2028   DTaP/Tdap/Td vaccine (4 - Td or Tdap) 09/01/2028   Pneumococcal Vaccine for age over 38  Completed   Hepatitis C Screening  Completed   Hepatitis B Vaccine  Aged Out   HPV Vaccine  Aged Out   Meningitis B Vaccine  Aged Out    Advanced directives: (Copy Requested) Please bring a copy of your health care power of attorney and living will to the office to be added to your chart at your convenience. You can mail to [facility] [address] [place], [place] [address] or email to [email or web address] Advance Care Planning is important because it:  [x]  Makes sure you receive the medical care that is consistent with your values, goals, and preferences  [x]  It  provides guidance to your family and loved ones and reduces their decisional burden about whether or not they are making the right decisions based on your wishes.  Follow the link provided in your after visit summary or read over the paperwork we have mailed to you to help you started getting your Advance Directives in place. If you need assistance in completing these, please reach out to us  so that we can help you!  See attachments for Preventive Care and Fall Prevention Tips.   Fall Prevention in the Home, Adult Falls can cause injuries and affect people of all ages. There are many simple things that you can do to make your home safe and to help prevent falls. If you need it, ask for help making these changes. What actions can I take to prevent falls? General information Use good lighting in all rooms. Make sure to: Replace any light bulbs that burn out. Turn on lights if it is dark and use night-lights. Keep items that you use often in easy-to-reach places. Lower the shelves around your home if needed. Move furniture so that there are clear paths around it. Do not keep throw rugs or other things on the floor that can make you trip. If any of your floors are uneven, fix them. Add color or contrast paint or tape to clearly mark and help you see: Grab  bars or handrails. First and last steps of staircases. Where the edge of each step is. If you use a ladder or stepladder: Make sure that it is fully opened. Do not climb a closed ladder. Make sure the sides of the ladder are locked in place. Have someone hold the ladder while you use it. Know where your pets are as you move through your home. What can I do in the bathroom?     Keep the floor dry. Clean up any water that is on the floor right away. Remove soap buildup in the bathtub or shower. Buildup makes bathtubs and showers slippery. Use non-skid mats or decals on the floor of the bathtub or shower. Attach bath mats securely with  double-sided, non-slip rug tape. If you need to sit down while you are in the shower, use a non-slip stool. Install grab bars by the toilet and in the bathtub and shower. Do not use towel bars as grab bars. What can I do in the bedroom? Make sure that you have a light by your bed that is easy to reach. Do not use any sheets or blankets on your bed that hang to the floor. Have a firm bench or chair with side arms that you can use for support when you get dressed. What can I do in the kitchen? Clean up any spills right away. If you need to reach something above you, use a sturdy step stool that has a grab bar. Keep electrical cables out of the way. Do not use floor polish or wax that makes floors slippery. What can I do with my stairs? Do not leave anything on the stairs. Make sure that you have a light switch at the top and the bottom of the stairs. Have them installed if you do not have them. Make sure that there are handrails on both sides of the stairs. Fix handrails that are broken or loose. Make sure that handrails are as long as the staircases. Install non-slip stair treads on all stairs in your home if they do not have carpet. Avoid having throw rugs at the top or bottom of stairs, or secure the rugs with carpet tape to prevent them from moving. Choose a carpet design that does not hide the edge of steps on the stairs. Make sure that carpet is firmly attached to the stairs. Fix any carpet that is loose or worn. What can I do on the outside of my home? Use bright outdoor lighting. Repair the edges of walkways and driveways and fix any cracks. Clear paths of anything that can make you trip, such as tools or rocks. Add color or contrast paint or tape to clearly mark and help you see high doorway thresholds. Trim any bushes or trees on the main path into your home. Check that handrails are securely fastened and in good repair. Both sides of all steps should have handrails. Install  guardrails along the edges of any raised decks or porches. Have leaves, snow, and ice cleared regularly. Use sand, salt, or ice melt on walkways during winter months if you live where there is ice and snow. In the garage, clean up any spills right away, including grease or oil spills. What other actions can I take? Review your medicines with your health care provider. Some medicines can make you confused or feel dizzy. This can increase your chance of falling. Wear closed-toe shoes that fit well and support your feet. Wear shoes that have rubber soles and low heels.  Use a cane, walker, scooter, or crutches that help you move around if needed. Talk with your provider about other ways that you can decrease your risk of falls. This may include seeing a physical therapist to learn to do exercises to improve movement and strength. Where to find more information Centers for Disease Control and Prevention, STEADI: TonerPromos.no [employer] on Aging: BaseRingTones.pl National Institute on Aging: BaseRingTones.pl Contact a health care provider if: You are afraid of falling at home. You feel weak, drowsy, or dizzy at home. You fall at home. Get help right away if you: Lose consciousness or have trouble moving after a fall. Have a fall that causes a head injury. These symptoms may be an emergency. Get help right away. Call 911. Do not wait to see if the symptoms will go away. Do not drive yourself to the hospital. This information is not intended to replace advice given to you by your health care provider. Make sure you discuss any questions you have with your health care provider. Document Revised: 12/03/2021 Document Reviewed: 12/03/2021 Elsevier Patient Education  2024 [employer].

## 2023-11-03 ENCOUNTER — Other Ambulatory Visit: Payer: Self-pay | Admitting: Student

## 2023-11-03 DIAGNOSIS — F419 Anxiety disorder, unspecified: Secondary | ICD-10-CM

## 2023-11-05 ENCOUNTER — Encounter: Payer: Self-pay | Admitting: Student

## 2023-11-05 ENCOUNTER — Other Ambulatory Visit: Payer: Self-pay | Admitting: Student

## 2023-11-05 DIAGNOSIS — F419 Anxiety disorder, unspecified: Secondary | ICD-10-CM

## 2023-11-05 MED ORDER — ALPRAZOLAM 0.5 MG PO TABS: ORAL_TABLET | ORAL | 0 refills | Status: DC

## 2023-11-05 NOTE — Telephone Encounter (Signed)
 Please review.  KP

## 2023-12-09 ENCOUNTER — Ambulatory Visit
Admission: RE | Admit: 2023-12-09 | Discharge: 2023-12-09 | Disposition: A | Discharge status: Home / Self Care | Source: Ambulatory Visit

## 2023-12-09 DIAGNOSIS — Z1231 Encounter for screening mammogram for malignant neoplasm of breast: Secondary | ICD-10-CM

## 2023-12-26 ENCOUNTER — Encounter: Payer: Self-pay | Admitting: Student

## 2023-12-26 ENCOUNTER — Other Ambulatory Visit: Payer: Self-pay | Admitting: Student

## 2023-12-26 DIAGNOSIS — G44229 Chronic tension-type headache, not intractable: Secondary | ICD-10-CM

## 2023-12-26 MED ORDER — BUTALBITAL-APAP-CAFFEINE 50-325-40 MG PO TABS: ORAL_TABLET | ORAL | 0 refills | Status: DC

## 2023-12-26 NOTE — Telephone Encounter (Signed)
 Please review and advise patient.   JM

## 2024-01-04 ENCOUNTER — Other Ambulatory Visit: Payer: Self-pay | Admitting: Student

## 2024-01-04 DIAGNOSIS — F419 Anxiety disorder, unspecified: Secondary | ICD-10-CM

## 2024-01-05 NOTE — Telephone Encounter (Signed)
 Please review.  KP

## 2024-01-05 NOTE — Telephone Encounter (Signed)
 Requested medications are due for refill today.  yes  Requested medications are on the active medications list.  yes  Last refill. 11/05/2023 #30 0 rf  Future visit scheduled.   yes  Notes to clinic.  Refill not delegated.    Requested Prescriptions  Pending Prescriptions Disp Refills   ALPRAZolam  (XANAX ) 0.5 MG tablet [Pharmacy Med Name: ALPRAZOLAM  0.5MG  TABLETS] 30 tablet     Sig: TAKE 1/2 TO 1 TABLET ONCE TO TWICE DAILY ONLY IF NEEDED FOR ANXIETY ATTACK. LIMIT TO 5 DAYS/WEEK TO AVOID ADDICTION/DEMENTIA     Not Delegated - Psychiatry: Anxiolytics/Hypnotics 2 Failed - 01/05/2024  3:25 PM      Failed - This refill cannot be delegated      Failed - Urine Drug Screen completed in last 360 days      Passed - Patient is not pregnant      Passed - Valid encounter within last 6 months    Recent Outpatient Visits           5 months ago Annual physical exam   [facility], [place]

## 2024-01-14 ENCOUNTER — Other Ambulatory Visit: Payer: Self-pay | Admitting: Medical Genetics

## 2024-01-20 ENCOUNTER — Encounter: Payer: Self-pay | Admitting: Student

## 2024-01-20 ENCOUNTER — Ambulatory Visit (INDEPENDENT_AMBULATORY_CARE_PROVIDER_SITE_OTHER): Admitting: Student

## 2024-01-20 VITALS — BP 118/64 | HR 62 | Ht 68.0 in | Wt 143.0 lb

## 2024-01-20 DIAGNOSIS — F411 Generalized anxiety disorder: Secondary | ICD-10-CM | POA: Diagnosis not present

## 2024-01-20 DIAGNOSIS — R1013 Epigastric pain: Secondary | ICD-10-CM | POA: Diagnosis not present

## 2024-01-20 DIAGNOSIS — E782 Mixed hyperlipidemia: Secondary | ICD-10-CM

## 2024-01-20 DIAGNOSIS — E875 Hyperkalemia: Secondary | ICD-10-CM | POA: Diagnosis not present

## 2024-01-20 MED ORDER — HYDROXYZINE HCL 10 MG PO TABS: 10.0000 mg | ORAL_TABLET | Freq: Three times a day (TID) | ORAL | 0 refills | Status: AC | PRN

## 2024-01-20 NOTE — Progress Notes (Signed)
 Established Patient Office Visit  Subjective   Patient ID: Briana Charles, female    DOB: Nov 18, 1955  Age: 68 y.o. MRN: [id number]  Chief Complaint  Patient presents with   Depression   Anxiety    Briana Charles with medical hx listed below presents today for follow up of depression and anxiety. Recently difficulties healthy of her husband and daughter.   Reports epigastric pain that occurs 1-2 times a month pain lasts 2-5 days. Slowly gets worse over the course of the day. Pain is dull and bloating sensation. Has tried pepcid and zantac without improvement. This has been occurring for the past year Does feel less appetitive with pain. She denies fever, dysphagia, cough, CP, n/v/d, constipation, hematochezia, melena, or weight loss.    Patient Active Problem List   Diagnosis Date Noted   Dyspepsia 01/20/2024   Hyperkalemia 08/14/2023   History of total abdominal hysterectomy 08/06/2023   Osteopenia 08/06/2023   Stress incontinence 08/06/2023   Genitourinary syndrome of menopause 08/06/2023   GAD (generalized anxiety disorder) 07/26/2019   Hyperlipidemia, mixed 02/02/2014   Annual physical exam 02/02/2014   IBS (irritable bowel syndrome)    Depression, controlled    Chronic tension-type headache, not intractable       ROS Refer to HPI    Objective:     Outpatient Encounter Medications as of 01/20/2024  Medication Sig   ALPRAZolam  (XANAX ) 0.5 MG tablet TAKE 1/2 TO 1 TABLET ONCE TO TWICE DAILY ONLY IF NEEDED FOR ANXIETY ATTACK. LIMIT TO 5 DAYS/WEEK TO AVOID ADDICTION/DEMENTIA   aspirin EC 81 MG tablet Take 81 mg by mouth daily. Swallow whole.   buPROPion  (WELLBUTRIN  XL) 300 MG 24 hr tablet Take 1 tablet Daily for Mood, Focus & Concentration                                /                                                                   TAKE                                         BY                                                 MOUTH    butalbital -acetaminophen-caffeine  (FIORICET) 50-325-40 MG tablet TAKE 1 TABLET BY MOUTH EVERY 4 HOURS AS NEEDED FOR SEVERE HEADACHE   CALCIUM-VITAMIN D  PO Take 1 tablet by mouth daily.   Cholecalciferol (VITAMIN D  PO) Take 5,000 Units by mouth daily.   estradiol  (CLIMARA  - DOSED IN MG/24 HR) 0.1 mg/24hr patch Place 1 patch (0.1 mg total) onto the skin once a week. Apply 1 patch  to Skin  every 7 days   hydrOXYzine (ATARAX) 10 MG tablet Take 1 tablet (10 mg total) by mouth 3 (three) times daily as needed for anxiety.   magnesium gluconate (MAGONATE) 500 MG tablet  Take 500 mg by mouth daily.   [DISCONTINUED] dicyclomine  (BENTYL ) 20 MG tablet Take  1/2 to 1 tablet   4 x /day  before meals & Bedtime for Nausea, Cramping, Bloating or Diarrhea (Patient taking differently: Take  1/2 to 1 tablet   4 x /day  before meals & Bedtime for Nausea, Cramping, Bloating or Diarrhea prn)   No facility-administered encounter medications on file as of 01/20/2024.    BP 118/64   Pulse 62   Ht 5' 8 (1.727 m)   Wt 143 lb (64.9 kg)   SpO2 98%   BMI 21.74 kg/m  BP Readings from Last 3 Encounters:  01/20/24 118/64  08/06/23 100/72  04/11/23 123/76    Physical Exam     01/20/2024    7:59 AM 10/29/2023    8:24 AM 08/06/2023    7:59 AM  Depression screen PHQ 2/9  Decreased Interest 0 0 1  Down, Depressed, Hopeless 0 0 0  PHQ - 2 Score 0 0 1  Altered sleeping 0 0 0  Tired, decreased energy 0 0 0  Change in appetite 0 1 1  Feeling bad or failure about yourself  0 0 0  Trouble concentrating 2 0 1  Moving slowly or fidgety/restless 0 0 1  Suicidal thoughts 0 0 0  PHQ-9 Score 2 1 4   Difficult doing work/chores Not difficult at all Not difficult at all Not difficult at all       01/20/2024    7:59 AM 08/06/2023    8:00 AM  GAD 7 : Generalized Anxiety Score  Nervous, Anxious, on Edge 2 1  Control/stop worrying 2 0  Worry too much - different things 2 1  Trouble relaxing 2 1  Restless 0 1  Easily  annoyed or irritable 0 0  Afraid - awful might happen 3 0  Total GAD 7 Score 11 4  Anxiety Difficulty Very difficult Not difficult at all    No results found for any visits on 01/20/24.  Last CBC Lab Results  Component Value Date   WBC 7.5 08/06/2023   HGB 13.5 08/06/2023   HCT 41.0 08/06/2023   MCV 90 08/06/2023   MCH 29.7 08/06/2023   RDW 12.5 08/06/2023   PLT 242 08/06/2023   Last metabolic panel Lab Results  Component Value Date   GLUCOSE 86 08/13/2023   NA 139 08/13/2023   K 5.5 (H) 08/13/2023   CL 102 08/13/2023   CO2 25 08/13/2023   BUN 21 08/13/2023   CREATININE 0.84 08/13/2023   EGFR 76 08/13/2023   CALCIUM 9.5 08/13/2023   PROT 6.4 08/06/2023   ALBUMIN 4.4 08/06/2023   LABGLOB 2.0 08/06/2023   BILITOT 0.2 08/06/2023   ALKPHOS 51 08/06/2023   AST 21 08/06/2023   ALT 14 08/06/2023   ANIONGAP 2 (L) 01/01/2015   Last lipids Lab Results  Component Value Date   CHOL 221 (H) 08/06/2023   HDL 81 08/06/2023   LDLCALC 126 (H) 08/06/2023   TRIG 79 08/06/2023   CHOLHDL 2.7 08/06/2023   Last hemoglobin A1c Lab Results  Component Value Date   HGBA1C 5.4 02/26/2023   Last thyroid  functions Lab Results  Component Value Date   TSH 1.29 02/26/2023      The 10-year ASCVD risk score (Arnett DK, et al., 2019) is: 6.9%    Assessment & Plan:  GAD (generalized anxiety disorder) Assessment & Plan: Increased anxiety due to caring for husband who has had 2 heart attacks  this year. Daughter also recently dx with breast cancer. Feels she does not have enough time for herself, periodically overwhelmed with stress, and unable to control her emotion. She is taking 1/4-1/2 tablets of xanax  daily with improvement. Discussed counseling which she is hesitant to try, did not feel it helped much in the past.  -Continue bupropion  300 mg daily -Hydroxyzine 10 mg as needed for anxiety to reduce xanax  use.  -Xanax  0.5 mg as needed, recommended avoiding daily use, urine screen  today -Encouraged she look on psychology today for counseling options    Orders: -     Drug Screen 12+Alcohol+CRT, Ur  Hyperkalemia Assessment & Plan: Elevated at last visit and on repeat. Will check CMP today.    Hyperlipidemia, mixed Assessment & Plan: Lipid panel today.   Orders: -     Lipid panel  Dyspepsia Assessment & Plan: Intermittent bloating sensation 1-2 times amount for the past year. Will check h.pylori and CMP today.   Orders: -     Comprehensive metabolic panel with GFR -     H. pylori breath test  Other orders -     hydrOXYzine HCl; Take 1 tablet (10 mg total) by mouth 3 (three) times daily as needed for anxiety.  Dispense: 30 tablet; Refill: 0     Return in about 3 months (around 04/21/2024) for anxiety, epigastric pain.    Harlene Saddler, MD

## 2024-01-20 NOTE — Assessment & Plan Note (Addendum)
 Increased anxiety due to caring for husband who has had 2 heart attacks this year. Daughter also recently dx with breast cancer. Feels she does not have enough time for herself, periodically overwhelmed with stress, and unable to control her emotion. She is taking 1/4-1/2 tablets of xanax  daily with improvement. Discussed counseling which she is hesitant to try, did not feel it helped much in the past.  -Continue bupropion  300 mg daily -Hydroxyzine 10 mg as needed for anxiety to reduce xanax  use.  -Xanax  0.5 mg as needed, recommended avoiding daily use, urine screen today -Encouraged she look on psychology today for counseling options

## 2024-01-20 NOTE — Assessment & Plan Note (Signed)
 Intermittent bloating sensation 1-2 times amount for the past year. Will check h.pylori and CMP today.

## 2024-01-20 NOTE — Assessment & Plan Note (Signed)
 Elevated at last visit and on repeat. Will check CMP today.

## 2024-01-20 NOTE — Assessment & Plan Note (Signed)
 Lipid panel today

## 2024-01-21 ENCOUNTER — Other Ambulatory Visit: Payer: Self-pay

## 2024-01-21 ENCOUNTER — Other Ambulatory Visit: Payer: Self-pay | Admitting: Student

## 2024-01-21 DIAGNOSIS — F411 Generalized anxiety disorder: Secondary | ICD-10-CM

## 2024-01-21 DIAGNOSIS — R1013 Epigastric pain: Secondary | ICD-10-CM | POA: Diagnosis not present

## 2024-01-21 DIAGNOSIS — E782 Mixed hyperlipidemia: Secondary | ICD-10-CM | POA: Diagnosis not present

## 2024-01-22 LAB — COMPREHENSIVE METABOLIC PANEL WITH GFR
ALT: 16 IU/L (ref 0–32)
AST: 21 IU/L (ref 0–40)
Albumin: 4.3 g/dL (ref 3.9–4.9)
Alkaline Phosphatase: 58 IU/L (ref 49–135)
BUN/Creatinine Ratio: 23 (ref 12–28)
BUN: 21 mg/dL (ref 8–27)
Bilirubin Total: 0.3 mg/dL (ref 0.0–1.2)
CO2: 22 mmol/L (ref 20–29)
Calcium: 9.3 mg/dL (ref 8.7–10.3)
Chloride: 100 mmol/L (ref 96–106)
Creatinine, Ser: 0.91 mg/dL (ref 0.57–1.00)
Globulin, Total: 2.1 g/dL (ref 1.5–4.5)
Glucose: 89 mg/dL (ref 70–99)
Potassium: 5.5 mmol/L — ABNORMAL HIGH (ref 3.5–5.2)
Sodium: 136 mmol/L (ref 134–144)
Total Protein: 6.4 g/dL (ref 6.0–8.5)
eGFR: 68 mL/min/1.73 (ref >59)

## 2024-01-22 LAB — LIPID PANEL
Chol/HDL Ratio: 2.7 ratio (ref 0.0–4.4)
Cholesterol, Total: 205 mg/dL — ABNORMAL HIGH (ref 100–199)
HDL: 77 mg/dL (ref >39)
LDL Chol Calc (NIH): 115 mg/dL — ABNORMAL HIGH (ref 0–99)
Triglycerides: 72 mg/dL (ref 0–149)
VLDL Cholesterol Cal: 13 mg/dL (ref 5–40)

## 2024-01-22 LAB — H. PYLORI BREATH TEST: H pylori Breath Test: NEGATIVE

## 2024-01-24 LAB — DRUG PROFILE, UR, 9 DRUGS (LABCORP)
Amphetamines, Urine: NEGATIVE ng/mL
Barbiturate Quant, Ur: NEGATIVE ng/mL
Benzodiazepine Quant, Ur: NEGATIVE ng/mL
Cannabinoid Quant, Ur: NEGATIVE ng/mL
Cocaine (Metab.): NEGATIVE ng/mL
Creatinine, Urine: 41.2 mg/dL (ref 20.0–300.0)
Methadone Screen, Urine: NEGATIVE ng/mL
Nitrite Urine, Quantitative: NEGATIVE ug/mL
OPIATE SCREEN URINE: NEGATIVE ng/mL
PCP Quant, Ur: NEGATIVE ng/mL
Propoxyphene: NEGATIVE ng/mL
pH, Urine: 6.1 (ref 4.5–8.9)

## 2024-01-26 ENCOUNTER — Encounter: Payer: Self-pay | Admitting: Student

## 2024-01-26 ENCOUNTER — Ambulatory Visit: Payer: Self-pay | Admitting: Student

## 2024-01-26 DIAGNOSIS — R1013 Epigastric pain: Secondary | ICD-10-CM

## 2024-01-26 DIAGNOSIS — E782 Mixed hyperlipidemia: Secondary | ICD-10-CM

## 2024-01-26 MED ORDER — ROSUVASTATIN CALCIUM 10 MG PO TABS: 10.0000 mg | ORAL_TABLET | Freq: Every day | ORAL | 3 refills | Status: AC

## 2024-01-26 NOTE — Telephone Encounter (Signed)
Please review patients reply.

## 2024-01-26 NOTE — Telephone Encounter (Signed)
 Please review patient's message:

## 2024-01-27 NOTE — Telephone Encounter (Signed)
 Please review

## 2024-01-28 ENCOUNTER — Other Ambulatory Visit: Payer: Self-pay | Admitting: Student

## 2024-01-28 DIAGNOSIS — E875 Hyperkalemia: Secondary | ICD-10-CM | POA: Diagnosis not present

## 2024-01-28 MED ORDER — PANTOPRAZOLE SODIUM 40 MG PO TBEC: 40.0000 mg | DELAYED_RELEASE_TABLET | Freq: Every day | ORAL | 3 refills | Status: DC

## 2024-01-28 NOTE — Telephone Encounter (Signed)
 Please review

## 2024-01-28 NOTE — Addendum Note (Signed)
 Addended by: CAMACHO OCAMPO, Majd Tissue M on: 01/28/2024 02:09 PM   Modules accepted: Orders

## 2024-02-02 ENCOUNTER — Encounter: Payer: Self-pay | Admitting: Student

## 2024-02-03 LAB — ALDOSTERONE + RENIN ACTIVITY W/ RATIO
Aldos/Renin Ratio: 6.5 (ref 0.0–30.0)
Aldosterone: 18.5 ng/dL (ref 0.0–30.0)
Renin Activity, Plasma: 2.854 ng/mL/h (ref 0.167–5.380)

## 2024-02-04 ENCOUNTER — Ambulatory Visit: Payer: Self-pay | Admitting: Student

## 2024-02-10 ENCOUNTER — Encounter: Payer: Self-pay | Admitting: Student

## 2024-02-14 ENCOUNTER — Other Ambulatory Visit: Payer: Self-pay | Admitting: Student

## 2024-02-14 DIAGNOSIS — G44229 Chronic tension-type headache, not intractable: Secondary | ICD-10-CM

## 2024-02-16 ENCOUNTER — Other Ambulatory Visit: Payer: Self-pay | Admitting: Student

## 2024-02-16 DIAGNOSIS — G44229 Chronic tension-type headache, not intractable: Secondary | ICD-10-CM

## 2024-02-16 NOTE — Telephone Encounter (Signed)
 Requested medication (s) are due for refill today: yes  Requested medication (s) are on the active medication list: yes  Last refill:  12/26/23  Future visit scheduled: yes  Notes to clinic:  Unable to refill per protocol, cannot delegate.      Requested Prescriptions  Pending Prescriptions Disp Refills   butalbital -acetaminophen-caffeine  (FIORICET) 50-325-40 MG tablet [Pharmacy Med Name: BUT/ACETAMINOPHEN/CAFF 50-325-40 TB] 30 tablet     Sig: TAKE 1 TABLET BY MOUTH EVERY 4 HOURS AS NEEDED FOR SEVERE HEADACHE     Not Delegated - Analgesics:  Non-Opioid Analgesic Combinations 2 Failed - 02/16/2024  3:27 PM      Failed - This refill cannot be delegated      Passed - Cr in normal range and within 360 days    Creat  Date Value Ref Range Status  02/26/2023 0.89 0.50 - 1.05 mg/dL Final   Creatinine, Ser  Date Value Ref Range Status  01/21/2024 0.91 0.57 - 1.00 mg/dL Final   Creatinine, Urine  Date Value Ref Range Status  08/07/2022 36 20 - 275 mg/dL Final         Passed - eGFR is 10 or above and within 360 days    GFR, Est African American  Date Value Ref Range Status  08/02/2020 78 > OR = 60 mL/min/1.47m2 Final   GFR, Est Non African American  Date Value Ref Range Status  08/02/2020 67 > OR = 60 mL/min/1.10m2 Final   eGFR  Date Value Ref Range Status  01/21/2024 68 >59 mL/min/1.73 Final         Passed - Patient is not pregnant      Passed - Valid encounter within last 12 months    Recent Outpatient Visits           3 weeks ago GAD (generalized anxiety disorder)   [place] [facility] at [facility], [place]   6 months ago Annual physical exam   [facility] at [facility], [place]

## 2024-02-16 NOTE — Telephone Encounter (Signed)
 Please review medication refill request

## 2024-02-17 NOTE — Telephone Encounter (Signed)
 Requested medication (s) are due for refill today - no  Requested medication (s) are on the active medication list -yes  Future visit scheduled -yes  Last refill: 02/16/24 #30  Notes to clinic: non delegated Rx  Requested Prescriptions  Pending Prescriptions Disp Refills   butalbital -acetaminophen-caffeine  (FIORICET) 50-325-40 MG tablet [Pharmacy Med Name: BUT/ACETAMINOPHEN/CAFF 50-325-40 TB] 30 tablet     Sig: TAKE 1 TABLET BY MOUTH EVERY 4 HOURS AS NEEDED FOR SEVERE HEADACHE     Not Delegated - Analgesics:  Non-Opioid Analgesic Combinations 2 Failed - 02/17/2024  2:49 PM      Failed - This refill cannot be delegated      Passed - Cr in normal range and within 360 days    Creat  Date Value Ref Range Status  02/26/2023 0.89 0.50 - 1.05 mg/dL Final   Creatinine, Ser  Date Value Ref Range Status  01/21/2024 0.91 0.57 - 1.00 mg/dL Final   Creatinine, Urine  Date Value Ref Range Status  08/07/2022 36 20 - 275 mg/dL Final         Passed - eGFR is 10 or above and within 360 days    GFR, Est African American  Date Value Ref Range Status  08/02/2020 78 > OR = 60 mL/min/1.75m2 Final   GFR, Est Non African American  Date Value Ref Range Status  08/02/2020 67 > OR = 60 mL/min/1.30m2 Final   eGFR  Date Value Ref Range Status  01/21/2024 68 >59 mL/min/1.73 Final         Passed - Patient is not pregnant      Passed - Valid encounter within last 12 months    Recent Outpatient Visits           4 weeks ago GAD (generalized anxiety disorder)   [place] [facility] at [facility] [place], [place]   6 months ago Annual physical exam   [place] [facility] at [facility], MD                 Requested Prescriptions  Pending Prescriptions Disp Refills   butalbital -acetaminophen-caffeine  (FIORICET) 50-325-40 MG tablet [Pharmacy Med Name: BUT/ACETAMINOPHEN/CAFF 50-325-40 TB] 30 tablet     Sig: TAKE 1  TABLET BY MOUTH EVERY 4 HOURS AS NEEDED FOR SEVERE HEADACHE     Not Delegated - Analgesics:  Non-Opioid Analgesic Combinations 2 Failed - 02/17/2024  2:49 PM      Failed - This refill cannot be delegated      Passed - Cr in normal range and within 360 days    Creat  Date Value Ref Range Status  02/26/2023 0.89 0.50 - 1.05 mg/dL Final   Creatinine, Ser  Date Value Ref Range Status  01/21/2024 0.91 0.57 - 1.00 mg/dL Final   Creatinine, Urine  Date Value Ref Range Status  08/07/2022 36 20 - 275 mg/dL Final         Passed - eGFR is 10 or above and within 360 days    GFR, Est African American  Date Value Ref Range Status  08/02/2020 78 > OR = 60 mL/min/1.22m2 Final   GFR, Est Non African American  Date Value Ref Range Status  08/02/2020 67 > OR = 60 mL/min/1.72m2 Final   eGFR  Date Value Ref Range Status  01/21/2024 68 >59 mL/min/1.73 Final         Passed - Patient is not pregnant      Passed -  Valid encounter within last 12 months    Recent Outpatient Visits           4 weeks ago GAD (generalized anxiety disorder)   [place] [facility] at [facility], [place]   6 months ago Annual physical exam   [facility] at [facility], [place]

## 2024-02-27 ENCOUNTER — Encounter: Payer: Self-pay | Admitting: Student

## 2024-02-27 ENCOUNTER — Ambulatory Visit (INDEPENDENT_AMBULATORY_CARE_PROVIDER_SITE_OTHER): Admitting: Student

## 2024-02-27 VITALS — BP 116/64 | HR 71 | Ht 68.0 in | Wt 146.0 lb

## 2024-02-27 DIAGNOSIS — R002 Palpitations: Secondary | ICD-10-CM | POA: Diagnosis not present

## 2024-02-27 DIAGNOSIS — R1013 Epigastric pain: Secondary | ICD-10-CM

## 2024-02-27 DIAGNOSIS — G44229 Chronic tension-type headache, not intractable: Secondary | ICD-10-CM | POA: Diagnosis not present

## 2024-02-27 NOTE — Progress Notes (Signed)
 Established Patient Office Visit  Subjective   Patient ID: Briana Charles, female    DOB: [birth year]  Age: 68 y.o. MRN: [id number]  Chief Complaint  Patient presents with   Nausea    X 2 weeks, tired Zofran did not help, maybe discuss that pantoprazole    Headache    Vision is blurry at times    Briana Charles with medical hx listed below presents today for headaches with associated nauseated for the 2 weeks ago. Headaches in the top of the head describes pain as a heaviness/pressure with associated intermittent blurred vision and nausea. Headache improves with sitting and resting. Has been taking fioricet for headache and tylenol about 4 days a week.   She denies fever, chills, eye pain, jaw pain, dizziness, weakness, or tingling.   Patient Active Problem List   Diagnosis Date Noted   Palpitations 02/27/2024   Dyspepsia 01/20/2024   Hyperkalemia 08/14/2023   History of total abdominal hysterectomy 08/06/2023   Osteopenia 08/06/2023   Stress incontinence 08/06/2023   Genitourinary syndrome of menopause 08/06/2023   GAD (generalized anxiety disorder) 07/26/2019   Hyperlipidemia, mixed 02/02/2014   Annual physical exam 02/02/2014   IBS (irritable bowel syndrome)    Depression, controlled    Chronic tension-type headache, not intractable       ROS Refer to HPI    Objective:     Outpatient Encounter Medications as of 02/27/2024  Medication Sig   ALPRAZolam  (XANAX ) 0.5 MG tablet TAKE 1/2 TO 1 TABLET ONCE TO TWICE DAILY ONLY IF NEEDED FOR ANXIETY ATTACK. LIMIT TO 5 DAYS/WEEK TO AVOID ADDICTION/DEMENTIA   aspirin EC 81 MG tablet Take 81 mg by mouth daily. Swallow whole.   buPROPion  (WELLBUTRIN  XL) 300 MG 24 hr tablet Take 1 tablet Daily for Mood, Focus & Concentration                                /                                                                   TAKE                                         BY                                                 MOUTH    butalbital -acetaminophen-caffeine  (FIORICET) 50-325-40 MG tablet TAKE 1 TABLET BY MOUTH EVERY 4 HOURS AS NEEDED FOR SEVERE HEADACHE   CALCIUM-VITAMIN D  PO Take 1 tablet by mouth daily.   Cholecalciferol (VITAMIN D  PO) Take 5,000 Units by mouth daily.   estradiol  (CLIMARA  - DOSED IN MG/24 HR) 0.1 mg/24hr patch Place 1 patch (0.1 mg total) onto the skin once a week. Apply 1 patch  to Skin  every 7 days   hydrOXYzine (ATARAX) 10 MG tablet Take 1 tablet (10 mg total) by mouth 3 (three) times daily as needed  for anxiety.   magnesium gluconate (MAGONATE) 500 MG tablet Take 500 mg by mouth daily.   pantoprazole (PROTONIX) 40 MG tablet Take 1 tablet (40 mg total) by mouth daily.   rosuvastatin (CRESTOR) 10 MG tablet Take 1 tablet (10 mg total) by mouth daily.   No facility-administered encounter medications on file as of 02/27/2024.    BP 116/64   Pulse 71   Ht 5' 8 (1.727 m)   Wt 146 lb (66.2 kg)   SpO2 96%   BMI 22.20 kg/m  BP Readings from Last 3 Encounters:  02/27/24 116/64  01/20/24 118/64  08/06/23 100/72    Physical Exam Constitutional:      Appearance: Normal appearance.  Eyes:     General: No visual field deficit.    Extraocular Movements: Extraocular movements intact.     Pupils: Pupils are equal, round, and reactive to light.  Cardiovascular:     Rate and Rhythm: Normal rate and regular rhythm.     Heart sounds: No murmur heard. Pulmonary:     Effort: Pulmonary effort is normal.     Breath sounds: No rhonchi or rales.  Abdominal:     General: Abdomen is flat. Bowel sounds are normal. There is no distension.     Palpations: Abdomen is soft.     Tenderness: There is no abdominal tenderness.  Musculoskeletal:        General: Normal range of motion.     Right lower leg: No edema.     Left lower leg: No edema.  Skin:    General: Skin is warm and dry.     Capillary Refill: Capillary refill takes less than 2 seconds.  Neurological:     General: No focal deficit present.      Mental Status: She is alert and oriented to person, place, and time.     Cranial Nerves: No cranial nerve deficit or facial asymmetry.     Motor: No weakness.  Psychiatric:        Mood and Affect: Mood normal.        Behavior: Behavior normal.        01/20/2024    7:59 AM 10/29/2023    8:24 AM 08/06/2023    7:59 AM  Depression screen PHQ 2/9  Decreased Interest 0 0 1  Down, Depressed, Hopeless 0 0 0  PHQ - 2 Score 0 0 1  Altered sleeping 0 0 0  Tired, decreased energy 0 0 0  Change in appetite 0 1 1  Feeling bad or failure about yourself  0 0 0  Trouble concentrating 2 0 1  Moving slowly or fidgety/restless 0 0 1  Suicidal thoughts 0 0 0  PHQ-9 Score 2  1  4    Difficult doing work/chores Not difficult at all Not difficult at all Not difficult at all     Data saved with a previous flowsheet row definition       01/20/2024    7:59 AM 08/06/2023    8:00 AM  GAD 7 : Generalized Anxiety Score  Nervous, Anxious, on Edge 2 1  Control/stop worrying 2 0  Worry too much - different things 2 1  Trouble relaxing 2 1  Restless 0 1  Easily annoyed or irritable 0 0  Afraid - awful might happen 3 0  Total GAD 7 Score 11 4  Anxiety Difficulty Very difficult Not difficult at all    No results found for any visits on 02/27/24.  Last CBC Lab  Results  Component Value Date   WBC 7.5 08/06/2023   HGB 13.5 08/06/2023   HCT 41.0 08/06/2023   MCV 90 08/06/2023   MCH 29.7 08/06/2023   RDW 12.5 08/06/2023   PLT 242 08/06/2023   Last metabolic panel Lab Results  Component Value Date   GLUCOSE 89 01/21/2024   NA 136 01/21/2024   K 5.5 (H) 01/21/2024   CL 100 01/21/2024   CO2 22 01/21/2024   BUN 21 01/21/2024   CREATININE 0.91 01/21/2024   EGFR 68 01/21/2024   CALCIUM 9.3 01/21/2024   PROT 6.4 01/21/2024   ALBUMIN 4.3 01/21/2024   LABGLOB 2.1 01/21/2024   BILITOT 0.3 01/21/2024   ALKPHOS 58 01/21/2024   AST 21 01/21/2024   ALT 16 01/21/2024   ANIONGAP 2 (L) 01/01/2015    Last lipids Lab Results  Component Value Date   CHOL 205 (H) 01/21/2024   HDL 77 01/21/2024   LDLCALC 115 (H) 01/21/2024   TRIG 72 01/21/2024   CHOLHDL 2.7 01/21/2024      The 10-year ASCVD risk score (Arnett DK, et al., 2019) is: 6.6%    Assessment & Plan:  Palpitations Assessment & Plan: This has been going of for the past 2-4 weeks. Reports multiple episodes everyday. Feels more hot than usual, with blurred vision, and anxiety. ECG with HR of 71, narrow complex and regular, normal intervals, no significant changes from prior ECG in 2024 or 2023. Discussed heart monitor given she was not having palpitations while ECG was performed. She would like to monitor symptoms for now. -TSH today  Orders: -     EKG 12-Lead -     TSH  Dyspepsia Assessment & Plan: This is much improved with pantoprazole however has been having more headaches since starting medication. Will have her stop pantoprazole and monitor symptoms for change. Follow up if headache is not improving.   Chronic tension-type headache, not intractable Assessment & Plan: Increase and frequency lately, possible medication overuse, dicussed reducing fiorcet and tylenol use. Discussed eye appointment given intermittent blurred vision. ESR and CRP today  Orders: -     Sedimentation rate -     C-reactive protein     Return if symptoms worsen or fail to improve, for headache.    Harlene Saddler, MD

## 2024-02-27 NOTE — Assessment & Plan Note (Addendum)
 This has been going of for the past 2-4 weeks. Reports multiple episodes everyday. Feels more hot than usual, with blurred vision, and anxiety. ECG with HR of 71, narrow complex and regular, normal intervals, no significant changes from prior ECG in 2024 or 2023. Discussed heart monitor given she was not having palpitations while ECG was performed. She would like to monitor symptoms for now. -TSH today

## 2024-02-27 NOTE — Assessment & Plan Note (Signed)
 This is much improved with pantoprazole however has been having more headaches since starting medication. Will have her stop pantoprazole and monitor symptoms for change. Follow up if headache is not improving.

## 2024-02-27 NOTE — Assessment & Plan Note (Signed)
 Increase and frequency lately, possible medication overuse, dicussed reducing fiorcet and tylenol use. Discussed eye appointment given intermittent blurred vision. ESR and CRP today

## 2024-02-28 LAB — TSH: TSH: 1.1 u[IU]/mL (ref 0.450–4.500)

## 2024-03-01 ENCOUNTER — Ambulatory Visit: Payer: Self-pay | Admitting: Student

## 2024-03-01 ENCOUNTER — Other Ambulatory Visit: Payer: Self-pay

## 2024-03-01 DIAGNOSIS — R519 Headache, unspecified: Secondary | ICD-10-CM

## 2024-03-01 DIAGNOSIS — H538 Other visual disturbances: Secondary | ICD-10-CM

## 2024-03-02 ENCOUNTER — Encounter: Payer: Self-pay | Admitting: Student

## 2024-03-02 NOTE — Telephone Encounter (Signed)
 fyi

## 2024-03-04 ENCOUNTER — Ambulatory Visit: Payer: Self-pay | Admitting: Student

## 2024-03-04 LAB — SEDIMENTATION RATE: Sed Rate: 4 mm/h (ref 0–40)

## 2024-03-04 LAB — C-REACTIVE PROTEIN: CRP: <1 mg/L (ref 0–10)

## 2024-03-15 ENCOUNTER — Other Ambulatory Visit: Payer: Self-pay | Admitting: Student

## 2024-03-15 DIAGNOSIS — G44229 Chronic tension-type headache, not intractable: Secondary | ICD-10-CM

## 2024-03-17 ENCOUNTER — Other Ambulatory Visit: Payer: Self-pay | Admitting: Student

## 2024-03-17 DIAGNOSIS — F419 Anxiety disorder, unspecified: Secondary | ICD-10-CM

## 2024-03-18 ENCOUNTER — Ambulatory Visit: Admitting: Student

## 2024-03-18 VITALS — BP 112/70 | HR 76 | Temp 97.7°F | Ht 68.0 in | Wt 147.0 lb

## 2024-03-18 DIAGNOSIS — J0100 Acute maxillary sinusitis, unspecified: Secondary | ICD-10-CM | POA: Diagnosis not present

## 2024-03-18 MED ORDER — ONDANSETRON 4 MG PO TBDP: 4.0000 mg | ORAL_TABLET | Freq: Three times a day (TID) | ORAL | 0 refills | Status: AC | PRN

## 2024-03-18 MED ORDER — AMOXICILLIN 875 MG PO TABS: 875.0000 mg | ORAL_TABLET | Freq: Two times a day (BID) | ORAL | 0 refills | Status: DC

## 2024-03-18 NOTE — Progress Notes (Signed)
 Established Patient Office Visit  Subjective   Patient ID: Briana Charles, female    DOB: 09/24/1955  Age: 68 y.o. MRN: [id number]  Chief Complaint  Patient presents with   Sinusitis    Nasal congestion, last Wednesday started having sore throat, headache , facial burning around nasal cavities, dry cough, has been doing Advil, tylenol and cough drops only    Briana Charles is a 68 y.o. person with medical hx listed below who presents today for sore throat with associated with dry cough about 1 week ago. Also endorses left sided sinus pain and congestion with associated nassuea that worsened yesterday. Has been taking Advil, tylenol, and cough drops. Reports she is prone getting sinus infection winter, last had antibiotics about 1 year ago.  She denies fever, dyspnea, chest pain, abdominal pain, v/d, lightheadedness, dizziness, hearing changes, or myalgias.   Patient Active Problem List   Diagnosis Date Noted   Palpitations 02/27/2024   Dyspepsia 01/20/2024   Hyperkalemia 08/14/2023   History of total abdominal hysterectomy 08/06/2023   Osteopenia 08/06/2023   Stress incontinence 08/06/2023   Genitourinary syndrome of menopause 08/06/2023   GAD (generalized anxiety disorder) 07/26/2019   Hyperlipidemia, mixed 02/02/2014   Annual physical exam 02/02/2014   IBS (irritable bowel syndrome)    Depression, controlled    Chronic tension-type headache, not intractable       ROS Refer to HPI    Objective:     Outpatient Encounter Medications as of 03/18/2024  Medication Sig   ALPRAZolam  (XANAX ) 0.5 MG tablet TAKE 1/2 TO 1 TABLET ONCE TO TWICE DAILY ONLY IF NEEDED FOR ANXIETY ATTACK. LIMIT TO 5 DAYS/WEEK TO AVOID ADDICTION/DEMENTIA   amoxicillin  (AMOXIL ) 875 MG tablet Take 1 tablet (875 mg total) by mouth 2 (two) times daily for 5 days.   aspirin EC 81 MG tablet Take 81 mg by mouth daily. Swallow whole.   buPROPion  (WELLBUTRIN  XL) 300 MG 24 hr tablet Take 1 tablet Daily for Mood,  Focus & Concentration                                /                                                                   TAKE                                         BY                                                 MOUTH   butalbital -acetaminophen-caffeine  (FIORICET) 50-325-40 MG tablet TAKE 1 TABLET BY MOUTH EVERY 4 HOURS AS NEEDED FOR SEVERE HEADACHE   CALCIUM -VITAMIN D  PO Take 1 tablet by mouth daily.   Cholecalciferol (VITAMIN D  PO) Take 5,000 Units by mouth daily.   estradiol  (CLIMARA  - DOSED IN MG/24 HR) 0.1 mg/24hr patch Place 1 patch (0.1 mg total) onto the skin once  a week. Apply 1 patch  to Skin  every 7 days   hydrOXYzine  (ATARAX ) 10 MG tablet Take 1 tablet (10 mg total) by mouth 3 (three) times daily as needed for anxiety.   magnesium gluconate (MAGONATE) 500 MG tablet Take 500 mg by mouth daily.   ondansetron (ZOFRAN-ODT) 4 MG disintegrating tablet Take 1 tablet (4 mg total) by mouth every 8 (eight) hours as needed for nausea or vomiting.   pantoprazole  (PROTONIX ) 40 MG tablet Take 1 tablet (40 mg total) by mouth daily.   rosuvastatin  (CRESTOR ) 10 MG tablet Take 1 tablet (10 mg total) by mouth daily.   No facility-administered encounter medications on file as of 03/18/2024.    BP 112/70   Pulse 76   Temp 97.7 F (36.5 C) (Oral)   Ht 5' 8 (1.727 m)   Wt 147 lb (66.7 kg)   SpO2 99%   BMI 22.35 kg/m  BP Readings from Last 3 Encounters:  03/18/24 112/70  02/27/24 116/64  01/20/24 118/64    Physical Exam Constitutional:      Appearance: Normal appearance.  HENT:     Right Ear: Tympanic membrane and external ear normal.     Left Ear: Tympanic membrane and external ear normal.     Nose: Congestion present.     Left Turbinates: Enlarged and swollen.     Left Sinus: Maxillary sinus tenderness present.     Mouth/Throat:     Mouth: Mucous membranes are moist.     Pharynx: Oropharynx is clear. No oropharyngeal exudate or posterior oropharyngeal erythema.  Cardiovascular:      Rate and Rhythm: Normal rate and regular rhythm.  Pulmonary:     Effort: Pulmonary effort is normal.     Breath sounds: No rhonchi or rales.  Abdominal:     General: Abdomen is flat. Bowel sounds are normal. There is no distension.     Palpations: Abdomen is soft.     Tenderness: There is no abdominal tenderness. There is no guarding or rebound.  Musculoskeletal:        General: Normal range of motion.     Right lower leg: No edema.     Left lower leg: No edema.  Lymphadenopathy:     Cervical: No cervical adenopathy.  Skin:    General: Skin is warm and dry.     Capillary Refill: Capillary refill takes less than 2 seconds.  Neurological:     General: No focal deficit present.     Mental Status: She is alert and oriented to person, place, and time.  Psychiatric:        Mood and Affect: Mood normal.        Behavior: Behavior normal.        03/18/2024   10:07 AM 01/20/2024    7:59 AM 10/29/2023    8:24 AM  Depression screen PHQ 2/9  Decreased Interest 0 0 0  Down, Depressed, Hopeless 0 0 0  PHQ - 2 Score 0 0 0  Altered sleeping  0 0  Tired, decreased energy  0 0  Change in appetite  0 1  Feeling bad or failure about yourself   0 0  Trouble concentrating  2 0  Moving slowly or fidgety/restless  0 0  Suicidal thoughts  0 0  PHQ-9 Score  2  1   Difficult doing work/chores  Not difficult at all Not difficult at all     Data saved with a previous flowsheet row definition  03/18/2024   10:07 AM 01/20/2024    7:59 AM 08/06/2023    8:00 AM  GAD 7 : Generalized Anxiety Score  Nervous, Anxious, on Edge 0 2 1  Control/stop worrying 0 2 0  Worry too much - different things  2 1  Trouble relaxing  2 1  Restless  0 1  Easily annoyed or irritable  0 0  Afraid - awful might happen  3 0  Total GAD 7 Score  11 4  Anxiety Difficulty  Very difficult Not difficult at all    No results found for any visits on 03/18/24.  Last CBC Lab Results  Component Value Date   WBC  7.5 08/06/2023   HGB 13.5 08/06/2023   HCT 41.0 08/06/2023   MCV 90 08/06/2023   MCH 29.7 08/06/2023   RDW 12.5 08/06/2023   PLT 242 08/06/2023   Last metabolic panel Lab Results  Component Value Date   GLUCOSE 89 01/21/2024   NA 136 01/21/2024   K 5.5 (H) 01/21/2024   CL 100 01/21/2024   CO2 22 01/21/2024   BUN 21 01/21/2024   CREATININE 0.91 01/21/2024   EGFR 68 01/21/2024   CALCIUM  9.3 01/21/2024   PROT 6.4 01/21/2024   ALBUMIN 4.3 01/21/2024   LABGLOB 2.1 01/21/2024   BILITOT 0.3 01/21/2024   ALKPHOS 58 01/21/2024   AST 21 01/21/2024   ALT 16 01/21/2024   ANIONGAP 2 (L) 01/01/2015   Last lipids Lab Results  Component Value Date   CHOL 205 (H) 01/21/2024   HDL 77 01/21/2024   LDLCALC 115 (H) 01/21/2024   TRIG 72 01/21/2024   CHOLHDL 2.7 01/21/2024      The 10-year ASCVD risk score (Arnett DK, et al., 2019) is: 6.2%    Assessment & Plan:  Acute non-recurrent maxillary sinusitis Worsening sinus congestion after 8 days of symptoms. Suspect se is developing secondary bacterial infection. Will treat with amoxicillin , intranasal corticosteroids as needed.  Other orders -     Amoxicillin ; Take 1 tablet (875 mg total) by mouth 2 (two) times daily for 5 days.  Dispense: 10 tablet; Refill: 0 -     Ondansetron; Take 1 tablet (4 mg total) by mouth every 8 (eight) hours as needed for nausea or vomiting.  Dispense: 10 tablet; Refill: 0    Return if symptoms worsen or fail to improve.    Harlene Saddler, MD

## 2024-03-19 ENCOUNTER — Other Ambulatory Visit: Payer: Self-pay | Admitting: Student

## 2024-03-19 ENCOUNTER — Encounter: Payer: Self-pay | Admitting: Student

## 2024-03-19 NOTE — Telephone Encounter (Signed)
 Please review.  KP

## 2024-03-22 ENCOUNTER — Encounter: Payer: Self-pay | Admitting: Student

## 2024-03-22 NOTE — Telephone Encounter (Signed)
 Please review patient's message:

## 2024-03-23 MED ORDER — AMOXICILLIN 875 MG PO TABS: 875.0000 mg | ORAL_TABLET | Freq: Two times a day (BID) | ORAL | 0 refills | Status: AC

## 2024-03-25 NOTE — Telephone Encounter (Signed)
 Please review patient's message:

## 2024-03-29 ENCOUNTER — Ambulatory Visit: Admitting: Student

## 2024-03-29 ENCOUNTER — Encounter: Payer: Self-pay | Admitting: Student

## 2024-03-29 VITALS — BP 130/82 | HR 62 | Wt 146.0 lb

## 2024-03-29 DIAGNOSIS — J019 Acute sinusitis, unspecified: Secondary | ICD-10-CM

## 2024-03-29 NOTE — Progress Notes (Unsigned)
 Established Patient Office Visit  Subjective   Patient ID: Briana Charles, female    DOB: [birth year]  Age: 68 y.o. MRN: [id number]  Chief Complaint  Patient presents with   Headache    X 5 days, getting worse, Shooting/Stabbing pain,left side, behind ear, constant, comes and goes    Arm Pain    X5-6 days, left arm, stabbing, 7 pain scale, heat, ice tylenol and ibuprofen, does not help a lot     Briana Charles is a 68 y.o. person with medical hx listed below who presents today for throbbing pain behind the left year radiating up towards the head that started 5 days ago. Has bene taking antibiotics  for sinus infection. Has not taken fiorcet in the past several days. Tried taking gabapentin  but that made it worse.  Generally feeling unwell  No fevers or chills   Denies trauma  Swollen behid the ear   Patient Active Problem List   Diagnosis Date Noted   Palpitations 02/27/2024   Dyspepsia 01/20/2024   Hyperkalemia 08/14/2023   History of total abdominal hysterectomy 08/06/2023   Osteopenia 08/06/2023   Stress incontinence 08/06/2023   Genitourinary syndrome of menopause 08/06/2023   GAD (generalized anxiety disorder) 07/26/2019   Hyperlipidemia, mixed 02/02/2014   Annual physical exam 02/02/2014   IBS (irritable bowel syndrome)    Depression, controlled    Chronic tension-type headache, not intractable       ROS Refer to HPI    Objective:     Outpatient Encounter Medications as of 03/29/2024  Medication Sig   ALPRAZolam  (XANAX ) 0.5 MG tablet TAKE 1/2 TO 1 TABLET BY MOUTH 1 TO 2 TIMES DAILY ONLY IF NEEDED FOR ANXIETY ATTACK. LIMIT TO 5 DAYS A WEEK TO AVOID ADDICTION OR DEMENTIA   amoxicillin  (AMOXIL ) 875 MG tablet Take 1 tablet (875 mg total) by mouth 2 (two) times daily for 7 days.   aspirin EC 81 MG tablet Take 81 mg by mouth daily. Swallow whole.   buPROPion  (WELLBUTRIN  XL) 300 MG 24 hr tablet Take 1 tablet Daily for Mood, Focus & Concentration                                 /                                                                   TAKE                                         BY                                                 MOUTH   butalbital -acetaminophen-caffeine  (FIORICET) 50-325-40 MG tablet TAKE 1 TABLET BY MOUTH EVERY 4 HOURS AS NEEDED FOR SEVERE HEADACHE   CALCIUM -VITAMIN D  PO Take 1 tablet by mouth daily.   Cholecalciferol (VITAMIN D  PO) Take 5,000 Units by mouth daily.   estradiol  (CLIMARA  -  DOSED IN MG/24 HR) 0.1 mg/24hr patch Place 1 patch (0.1 mg total) onto the skin once a week. Apply 1 patch  to Skin  every 7 days   gabapentin  (NEURONTIN ) 100 MG capsule    hydrOXYzine  (ATARAX ) 10 MG tablet Take 1 tablet (10 mg total) by mouth 3 (three) times daily as needed for anxiety.   magnesium gluconate (MAGONATE) 500 MG tablet Take 500 mg by mouth daily.   ondansetron  (ZOFRAN -ODT) 4 MG disintegrating tablet Take 1 tablet (4 mg total) by mouth every 8 (eight) hours as needed for nausea or vomiting.   pantoprazole  (PROTONIX ) 40 MG tablet Take 1 tablet (40 mg total) by mouth daily.   rosuvastatin  (CRESTOR ) 10 MG tablet Take 1 tablet (10 mg total) by mouth daily.   No facility-administered encounter medications on file as of 03/29/2024.    BP 130/82   Pulse 62   Wt 146 lb (66.2 kg)   SpO2 98%   BMI 22.20 kg/m  BP Readings from Last 3 Encounters:  03/29/24 130/82  03/18/24 112/70  02/27/24 116/64    Physical Exam     03/18/2024   10:07 AM 01/20/2024    7:59 AM 10/29/2023    8:24 AM  Depression screen PHQ 2/9  Decreased Interest 0 0 0  Down, Depressed, Hopeless 0 0 0  PHQ - 2 Score 0 0 0  Altered sleeping  0 0  Tired, decreased energy  0 0  Change in appetite  0 1  Feeling bad or failure about yourself   0 0  Trouble concentrating  2 0  Moving slowly or fidgety/restless  0 0  Suicidal thoughts  0 0  PHQ-9 Score  2  1   Difficult doing work/chores  Not difficult at all Not difficult at all     Data saved with a previous  flowsheet row definition       03/18/2024   10:07 AM 01/20/2024    7:59 AM 08/06/2023    8:00 AM  GAD 7 : Generalized Anxiety Score  Nervous, Anxious, on Edge 0 2 1  Control/stop worrying 0 2 0  Worry too much - different things  2 1  Trouble relaxing  2 1  Restless  0 1  Easily annoyed or irritable  0 0  Afraid - awful might happen  3 0  Total GAD 7 Score  11 4  Anxiety Difficulty  Very difficult Not difficult at all    No results found for any visits on 03/29/24.  Last CBC Lab Results  Component Value Date   WBC 7.5 08/06/2023   HGB 13.5 08/06/2023   HCT 41.0 08/06/2023   MCV 90 08/06/2023   MCH 29.7 08/06/2023   RDW 12.5 08/06/2023   PLT 242 08/06/2023   Last metabolic panel Lab Results  Component Value Date   GLUCOSE 89 01/21/2024   NA 136 01/21/2024   K 5.5 (H) 01/21/2024   CL 100 01/21/2024   CO2 22 01/21/2024   BUN 21 01/21/2024   CREATININE 0.91 01/21/2024   EGFR 68 01/21/2024   CALCIUM  9.3 01/21/2024   PROT 6.4 01/21/2024   ALBUMIN 4.3 01/21/2024   LABGLOB 2.1 01/21/2024   BILITOT 0.3 01/21/2024   ALKPHOS 58 01/21/2024   AST 21 01/21/2024   ALT 16 01/21/2024   ANIONGAP 2 (L) 01/01/2015   Last lipids Lab Results  Component Value Date   CHOL 205 (H) 01/21/2024   HDL 77 01/21/2024   LDLCALC 115 (H)  01/21/2024   TRIG 72 01/21/2024   CHOLHDL 2.7 01/21/2024   Last hemoglobin A1c Lab Results  Component Value Date   HGBA1C 5.4 02/26/2023      The 10-year ASCVD risk score (Arnett DK, et al., 2019) is: 8.2%    Assessment & Plan:  There are no diagnoses linked to this encounter.   No follow-ups on file.    Harlene Saddler, MD

## 2024-03-30 NOTE — Telephone Encounter (Signed)
 Please review patient message.  Briana Charles

## 2024-04-20 ENCOUNTER — Ambulatory Visit: Admitting: Family Medicine

## 2024-04-21 ENCOUNTER — Ambulatory Visit: Admitting: Student

## 2024-04-24 ENCOUNTER — Other Ambulatory Visit: Payer: Self-pay | Admitting: Student

## 2024-04-24 DIAGNOSIS — R1013 Epigastric pain: Secondary | ICD-10-CM

## 2024-04-25 ENCOUNTER — Other Ambulatory Visit: Payer: Self-pay | Admitting: Student

## 2024-04-25 DIAGNOSIS — R1013 Epigastric pain: Secondary | ICD-10-CM

## 2024-04-26 NOTE — Telephone Encounter (Signed)
 Requested Prescriptions  Pending Prescriptions Disp Refills   pantoprazole  (PROTONIX ) 40 MG tablet [Pharmacy Med Name: PANTOPRAZOLE  40MG  TABLETS] 90 tablet 1    Sig: TAKE 1 TABLET(40 MG) BY MOUTH DAILY     Gastroenterology: Proton Pump Inhibitors Passed - 04/26/2024  2:17 PM      Passed - Valid encounter within last 12 months    Recent Outpatient Visits           4 weeks ago Acute non-recurrent sinusitis, unspecified location   [facility] at [facility], [place]   1 month ago Acute non-recurrent maxillary sinusitis   [place] [facility] at [facility], MD   1 month ago Palpitations   [place] [facility] at [facility], MD   3 months ago GAD (generalized anxiety disorder)   [facility] at [facility], MD   8 months ago Annual physical exam   [facility] at [facility], MD

## 2024-04-27 NOTE — Telephone Encounter (Signed)
 Duplicate request, refilled 04/26/24.  Requested Prescriptions  Pending Prescriptions Disp Refills   pantoprazole  (PROTONIX ) 40 MG tablet [Pharmacy Med Name: PANTOPRAZOLE  40MG  TABLETS] 30 tablet 3    Sig: TAKE 1 TABLET(40 MG) BY MOUTH DAILY     Gastroenterology: Proton Pump Inhibitors Passed - 04/27/2024  8:25 AM      Passed - Valid encounter within last 12 months    Recent Outpatient Visits           4 weeks ago Acute non-recurrent sinusitis, unspecified location   [facility] at [facility], MD   1 month ago Acute non-recurrent maxillary sinusitis   [facility] at [facility], MD   2 months ago Palpitations   [place] [facility] at [facility], MD   3 months ago GAD (generalized anxiety disorder)   [facility] at [facility], MD   8 months ago Annual physical exam   [facility] at [facility], MD

## 2024-05-19 ENCOUNTER — Other Ambulatory Visit: Payer: Self-pay | Admitting: Medical Genetics

## 2024-05-19 DIAGNOSIS — Z006 Encounter for examination for normal comparison and control in clinical research program: Secondary | ICD-10-CM

## 2024-11-03 ENCOUNTER — Ambulatory Visit

## 2024-11-04 ENCOUNTER — Ambulatory Visit

## 2024-11-11 ENCOUNTER — Ambulatory Visit
